# Patient Record
Sex: Female | Born: 1995 | Race: Black or African American | Hispanic: No | Marital: Single | Smoking: Never smoker
Health system: Southern US, Community
[De-identification: ages and names within clinical notes are randomized; demographics above are authoritative.]

---
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
## 2017-07-07 ENCOUNTER — Emergency Department (HOSPITAL_COMMUNITY)
Admission: EM | Admit: 2017-07-07 | Discharge: 2017-07-07 | Disposition: A | Discharge status: Home / Self Care | Payer: Self-pay | Attending: Emergency Medicine | Admitting: Emergency Medicine

## 2017-07-07 ENCOUNTER — Emergency Department (HOSPITAL_COMMUNITY): Payer: Self-pay

## 2017-07-07 ENCOUNTER — Encounter (HOSPITAL_COMMUNITY): Payer: Self-pay | Admitting: Emergency Medicine

## 2017-07-07 DIAGNOSIS — Z79899 Other long term (current) drug therapy: Secondary | ICD-10-CM | POA: Insufficient documentation

## 2017-07-07 DIAGNOSIS — R0789 Other chest pain: Secondary | ICD-10-CM | POA: Insufficient documentation

## 2017-07-07 LAB — CBC
HCT: 40 % (ref 36.0–46.0)
Hemoglobin: 13.5 g/dL (ref 12.0–15.0)
MCH: 31.1 pg (ref 26.0–34.0)
MCHC: 33.8 g/dL (ref 30.0–36.0)
MCV: 92.2 fL (ref 78.0–100.0)
PLATELETS: 205 10*3/uL (ref 150–400)
RBC: 4.34 MIL/uL (ref 3.87–5.11)
RDW: 11.2 % — ABNORMAL LOW (ref 11.5–15.5)
WBC: 4.3 10*3/uL (ref 4.0–10.5)

## 2017-07-07 LAB — BASIC METABOLIC PANEL
ANION GAP: 8 (ref 5–15)
BUN: 9 mg/dL (ref 6–20)
CO2: 24 mmol/L (ref 22–32)
CREATININE: 1.01 mg/dL — AB (ref 0.44–1.00)
Calcium: 9.1 mg/dL (ref 8.9–10.3)
Chloride: 105 mmol/L (ref 101–111)
GFR calc Af Amer: >60 mL/min (ref >60)
GFR calc non Af Amer: >60 mL/min (ref >60)
Glucose, Bld: 92 mg/dL (ref 65–99)
Potassium: 4 mmol/L (ref 3.5–5.1)
Sodium: 137 mmol/L (ref 135–145)

## 2017-07-07 LAB — I-STAT TROPONIN, ED: TROPONIN I, POC: 0 ng/mL (ref 0.00–0.08)

## 2017-07-07 LAB — I-STAT BETA HCG BLOOD, ED (MC, WL, AP ONLY): I-stat hCG, quantitative: <5 m[IU]/mL (ref <5)

## 2017-07-07 MED ORDER — IBUPROFEN 400 MG PO TABS: 400.0000 mg | ORAL_TABLET | Freq: Three times a day (TID) | ORAL | 0 refills | Status: AC

## 2017-07-07 NOTE — ED Provider Notes (Signed)
[facility] EMERGENCY DEPARTMENT Provider Note   CSN: [id number] Arrival date & time: 07/07/17  1609     History   Chief Complaint Chief Complaint  Patient presents with  . Chest Pain    HPI Diana Phillips is a 22 y.o. female.  HPI Vision presents with concern of chest pain. Pain is sternal, vertically oriented, sore, occurs without clear precipitant, last for several days, stops without clear intervention, pain is nonradiating, no associated dyspnea, is not exertional, patient notes that she is able to practice basketball without worsening of her pain. She is a Pharmacist, hospital, is generally healthy, has no history of congenital cardiac disease, patient's mother eventually joins her, who reiterates the same. Patient denies other ongoing complaints.   History reviewed. No pertinent past medical history.  Patient Active Problem List   Diagnosis Date Noted  . Fracture of radial neck, right, closed 02/02/2014    History reviewed. No pertinent surgical history.   OB History   None      Home Medications    Prior to Admission medications   Medication Sig Start Date End Date Taking? Authorizing Provider  erythromycin [facility]) ophthalmic ointment Place 1 application into the left eye 3 (three) times daily. 01/01/15   Lavera Guise, MD  ibuprofen (ADVIL,MOTRIN) 400 MG tablet Take 1 tablet (400 mg total) by mouth 3 (three) times daily for 3 days. Take one tablet three times daily for three days 07/07/17 07/10/17  Gerhard Munch, MD  loratadine (CLARITIN) 10 MG tablet Take 10 mg by mouth daily.    [provider]  omeprazole (PRILOSEC) 20 MG capsule Take 1 capsule (20 mg total) by mouth daily. 07/05/15   Roxy Horseman, PA-C  trimethoprim-polymyxin b (POLYTRIM) ophthalmic solution Place 1 drop into both eyes every 4 (four) hours. 07/05/15   Roxy Horseman, PA-C    Family History History reviewed. No pertinent family history.  Social History Social  History   Tobacco Use  . Smoking status: Never Smoker  . Smokeless tobacco: Never Used  Substance Use Topics  . Alcohol use: No    Alcohol/week: 0.0 oz  . Drug use: No     Allergies   Patient has no known allergies.   Review of Systems Review of Systems  Constitutional:       Per HPI, otherwise negative  HENT:       Per HPI, otherwise negative  Respiratory:       Per HPI, otherwise negative  Cardiovascular:       Per HPI, otherwise negative  Gastrointestinal: Negative for vomiting.  Endocrine:       Negative aside from HPI  Genitourinary:       Neg aside from HPI   Musculoskeletal:       Per HPI, otherwise negative  Skin: Negative.   Neurological: Negative for syncope.     Physical Exam Updated Vital Signs BP 139/79 (BP Location: Right Arm)   Pulse 64   Temp 99.6 F (37.6 C) (Oral)   Resp 14   LMP 05/30/2017   SpO2 100%   Physical Exam  Constitutional: She is oriented to person, place, and time. She appears well-developed and well-nourished. No distress.  HENT:  Head: Normocephalic and atraumatic.  Eyes: Conjunctivae and EOM are normal.  Cardiovascular: Normal rate and regular rhythm.  Pulmonary/Chest: Effort normal and breath sounds normal. No stridor. No respiratory distress.  Pain elicited with pressure over the sternum  Abdominal: She exhibits no distension.  Musculoskeletal: She  exhibits no edema.  Neurological: She is alert and oriented to person, place, and time. No cranial nerve deficit.  Skin: Skin is warm and dry.  Psychiatric: She has a normal mood and affect.  Nursing note and vitals reviewed.    ED Treatments / Results  Labs (all labs ordered are listed, but only abnormal results are displayed) Labs Reviewed  BASIC METABOLIC PANEL - Abnormal; Notable for the following components:      Result Value   Creatinine, Ser 1.01 (*)    All other components within normal limits  CBC - Abnormal; Notable for the following components:   RDW  11.2 (*)    All other components within normal limits  I-STAT TROPONIN, ED  I-STAT BETA HCG BLOOD, ED ([facility], [facility], [facility] ONLY)    EKG EKG Interpretation  Date/Time:  Saturday July 07 2017 16:19:30 EDT Ventricular Rate:  71 PR Interval:  142 QRS Duration: 68 QT Interval:  384 QTC Calculation: 417 R Axis:   74 Text Interpretation:  Normal sinus rhythm with sinus arrhythmia Normal ECG unremarkable ECG Confirmed by Gerhard MunchLockwood, Shermon Bozzi [phone]([id number]) on 07/07/2017 5:56:33 PM   Radiology Dg Chest 2 View  Result Date: 07/07/2017 CLINICAL DATA:  Left-sided chest pain. EXAM: CHEST - 2 VIEW COMPARISON:  Two-view chest x-ray 07/05/2015 FINDINGS: The heart size and mediastinal contours are within normal limits. Both lungs are clear. The visualized skeletal structures are unremarkable. IMPRESSION: Negative two view chest x-ray Electronically Signed   By: Marin Robertshristopher  Mattern M.D.   On: 07/07/2017 17:26    Procedures Procedures (including critical care time)  Medications Ordered in ED Medications - No data to display   Initial Impression / Assessment and Plan / ED Course  I have reviewed the triage vital signs and the nursing notes.  Pertinent labs & imaging results that were available during my care of the patient were reviewed by me and considered in my medical decision making (see chart for details).  Appearing young female with no history of congenital cardiac disease, or risk factors presents with ongoing episodic days long episodes of chest pain that is reproducible. With no exertional components, reassuring examination here, minimal risk profile, low suspicion for ischemic disease, some suspicion for musculoskeletal etiology.  With this was discussed at length with the patient and her mother and a companion. Patient discharged with ongoing NSAID therapy, primary care follow-up.  Final Clinical Impressions(s) / ED Diagnoses   Final diagnoses:  Atypical chest pain    ED Discharge Orders         Ordered    ibuprofen (ADVIL,MOTRIN) 400 MG tablet  3 times daily     07/07/17 Tawnya [id number]       Taniya Dasher, MD 07/07/17 1950

## 2017-07-07 NOTE — Discharge Instructions (Signed)
As discussed, your evaluation today has been largely reassuring.  But, it is important that you monitor your condition carefully, and do not hesitate to return to the ED if you develop new, or concerning changes in your condition.  Your pain is likely coming from inflammation of the chest wall itself.  Please follow-up with your physician for appropriate ongoing care.

## 2017-07-07 NOTE — ED Triage Notes (Signed)
Pt presents to ED for sudden onset of left sided chest pain while laying in bed this morning.  C/o shortness of breath with them, denies nausea, c/o constant dizziness.

## 2020-01-24 ENCOUNTER — Emergency Department (HOSPITAL_COMMUNITY)
Admission: EM | Admit: 2020-01-24 | Discharge: 2020-01-24 | Disposition: A | Discharge status: Home / Self Care | Payer: Medicaid Other | Attending: Emergency Medicine | Admitting: Emergency Medicine

## 2020-01-24 ENCOUNTER — Other Ambulatory Visit: Payer: Self-pay

## 2020-01-24 ENCOUNTER — Encounter (HOSPITAL_COMMUNITY): Payer: Self-pay | Admitting: Emergency Medicine

## 2020-01-24 DIAGNOSIS — Z20822 Contact with and (suspected) exposure to covid-19: Secondary | ICD-10-CM

## 2020-01-24 DIAGNOSIS — R509 Fever, unspecified: Secondary | ICD-10-CM | POA: Diagnosis present

## 2020-01-24 DIAGNOSIS — U071 COVID-19: Secondary | ICD-10-CM | POA: Insufficient documentation

## 2020-01-24 LAB — RESP PANEL BY RT-PCR (FLU A&B, COVID) ARPGX2
Influenza A by PCR: NEGATIVE
Influenza B by PCR: NEGATIVE
SARS Coronavirus 2 by RT PCR: POSITIVE — AB

## 2020-01-24 NOTE — ED Triage Notes (Signed)
Patient requesting Covid test reports productive cough , chest congestion , rhinorrhea , fever and chills this week .

## 2020-01-24 NOTE — ED Notes (Signed)
Patient verbalizes understanding of discharge instructions. Opportunity for questioning and answers were provided. Armband removed by staff, pt discharged from ED ambulatory.   

## 2020-01-25 NOTE — ED Provider Notes (Signed)
[facility] EMERGENCY DEPARTMENT Provider Note   CSN: [id number] Arrival date & time: 01/24/20  1958     History Chief Complaint  Patient presents with   Covid test / Cough/Fever    Diana Phillips is a 24 y.o. female.  The history is provided by the patient.  URI Presenting symptoms: congestion, cough, fatigue, fever and sore throat   Severity:  Moderate Onset quality:  Gradual Duration:  5 days Timing:  Constant Progression:  Waxing and waning Chronicity:  New Relieved by:  None tried Worsened by:  Nothing Ineffective treatments:  None tried Associated symptoms: headaches   Associated symptoms: no neck pain   Associated symptoms comment:  No SOB Risk factors: sick contacts   Risk factors comment:  No medical problems.  recent teammate with COVID positive      History reviewed. No pertinent past medical history.  Patient Active Problem List   Diagnosis Date Noted   Fracture of radial neck, right, closed 02/02/2014    History reviewed. No pertinent surgical history.   OB History   No obstetric history on file.     No family history on file.  Social History   Tobacco Use   Smoking status: Never Smoker   Smokeless tobacco: Never Used  Substance Use Topics   Alcohol use: No    Alcohol/week: 0.0 standard drinks   Drug use: No    Home Medications Prior to Admission medications   Medication Sig Start Date End Date Taking? Authorizing Provider  erythromycin [facility]) ophthalmic ointment Place 1 application into the left eye 3 (three) times daily. 01/01/15   Lavera Guise, MD  loratadine (CLARITIN) 10 MG tablet Take 10 mg by mouth daily.    [provider]  omeprazole (PRILOSEC) 20 MG capsule Take 1 capsule (20 mg total) by mouth daily. 07/05/15   Roxy Horseman, PA-C  trimethoprim-polymyxin b (POLYTRIM) ophthalmic solution Place 1 drop into both eyes every 4 (four) hours. 07/05/15   Roxy Horseman, PA-C    Allergies     Patient has no known allergies.  Review of Systems   Review of Systems  Constitutional: Positive for fatigue and fever.  HENT: Positive for congestion and sore throat.   Respiratory: Positive for cough.   Musculoskeletal: Negative for neck pain.  Neurological: Positive for headaches.  All other systems reviewed and are negative.   Physical Exam Updated Vital Signs BP 134/87    Pulse 72    Temp (!) 97.5 F (36.4 C) (Oral)    Resp 16    Ht 5\' 6"  (1.676 m)    Wt 77 kg    LMP 01/08/2020    SpO2 100%    BMI 27.40 kg/m   Physical Exam Vitals and nursing note reviewed.  Constitutional:      General: She is not in acute distress.    Appearance: Normal appearance. She is well-developed, normal weight and well-nourished.  HENT:     Head: Normocephalic and atraumatic.     Mouth/Throat:     Mouth: Mucous membranes are moist.     Pharynx: Posterior oropharyngeal erythema present. No oropharyngeal exudate.  Eyes:     Extraocular Movements: EOM normal.     Pupils: Pupils are equal, round, and reactive to light.  Cardiovascular:     Rate and Rhythm: Normal rate and regular rhythm.     Pulses: Intact distal pulses.     Heart sounds: Normal heart sounds. No murmur heard. No friction rub.  Pulmonary:  Effort: Pulmonary effort is normal.     Breath sounds: Normal breath sounds. No wheezing or rales.  Abdominal:     General: Bowel sounds are normal. There is no distension.     Palpations: Abdomen is soft.     Tenderness: There is no abdominal tenderness. There is no guarding or rebound.  Musculoskeletal:        General: No tenderness. Normal range of motion.     Comments: No edema  Lymphadenopathy:     Cervical: No cervical adenopathy.  Skin:    General: Skin is warm and dry.     Findings: No rash.  Neurological:     Mental Status: She is alert and oriented to person, place, and time.     Cranial Nerves: No cranial nerve deficit.  Psychiatric:        Mood and Affect: Mood and  affect and mood normal.        Behavior: Behavior normal.        Thought Content: Thought content normal.     ED Results / Procedures / Treatments   Labs (all labs ordered are listed, but only abnormal results are displayed) Labs Reviewed  RESP PANEL BY RT-PCR (FLU A&B, COVID) ARPGX2 - Abnormal; Notable for the following components:      Result Value   SARS Coronavirus 2 by RT PCR POSITIVE (*)    All other components within normal limits    EKG None  Radiology No results found.  Procedures Procedures (including critical care time)  Medications Ordered in ED Medications - No data to display  ED Course  I have reviewed the triage vital signs and the nursing notes.  Pertinent labs & imaging results that were available during my care of the patient were reviewed by me and considered in my medical decision making (see chart for details).    MDM Rules/Calculators/A&P                          Pt with symptoms consistent with COVID.  Well appearing here.  No signs of breathing difficulty  No signs of pharyngitis, otitis or abnormal abdominal findings.  Pt is COVID positive.  Otherwise she is sating well and well appearing.  Pt d/ced home.  Diana Phillips was evaluated in Emergency Department on 01/25/2020 for the symptoms described in the history of present illness. She was evaluated in the context of the global COVID-19 pandemic, which necessitated consideration that the patient might be at risk for infection with the SARS-CoV-2 virus that causes COVID-19. Institutional protocols and algorithms that pertain to the evaluation of patients at risk for COVID-19 are in a state of rapid change based on information released by regulatory bodies including the CDC and federal and state organizations. These policies and algorithms were followed during the patient's care in the ED.    Final Clinical Impression(s) / ED Diagnoses Final diagnoses:  Suspected COVID-19 virus infection    Rx /  DC Orders ED Discharge Orders    None       Gwyneth Sprout, MD 01/25/20 [phone]

## 2020-10-26 ENCOUNTER — Emergency Department (HOSPITAL_COMMUNITY): Payer: Medicaid Other

## 2020-10-26 ENCOUNTER — Other Ambulatory Visit: Payer: Self-pay

## 2020-10-26 ENCOUNTER — Emergency Department (HOSPITAL_COMMUNITY)
Admission: EM | Admit: 2020-10-26 | Discharge: 2020-10-27 | Disposition: A | Discharge status: Home / Self Care | Payer: Medicaid Other | Attending: Emergency Medicine | Admitting: Emergency Medicine

## 2020-10-26 DIAGNOSIS — W228XXA Striking against or struck by other objects, initial encounter: Secondary | ICD-10-CM | POA: Insufficient documentation

## 2020-10-26 DIAGNOSIS — S61411A Laceration without foreign body of right hand, initial encounter: Secondary | ICD-10-CM | POA: Diagnosis not present

## 2020-10-26 DIAGNOSIS — Z5321 Procedure and treatment not carried out due to patient leaving prior to being seen by health care provider: Secondary | ICD-10-CM | POA: Diagnosis not present

## 2020-10-26 DIAGNOSIS — S6991XA Unspecified injury of right wrist, hand and finger(s), initial encounter: Secondary | ICD-10-CM | POA: Diagnosis present

## 2020-10-26 NOTE — ED Notes (Signed)
Patient called x1 for re-assessment with no response

## 2020-10-26 NOTE — ED Triage Notes (Signed)
Pt states punching car window. Presents with several laceration to right hand. Bleeding contained. Unknown last tetanus

## 2020-10-27 NOTE — ED Notes (Signed)
PT was call for room X3 did not answer.

## 2020-11-11 ENCOUNTER — Ambulatory Visit (HOSPITAL_COMMUNITY)
Admission: EM | Admit: 2020-11-11 | Discharge: 2020-11-11 | Disposition: A | Discharge status: Other Acute Inpatient Hospital | Payer: Medicaid Other | Attending: Family | Admitting: Family

## 2020-11-11 ENCOUNTER — Other Ambulatory Visit: Payer: Self-pay

## 2020-11-11 ENCOUNTER — Inpatient Hospital Stay (HOSPITAL_COMMUNITY)
Admission: AD | Admit: 2020-11-11 | Discharge: 2020-11-15 | DRG: 885 | Disposition: A | Discharge status: Home / Self Care | Payer: Medicaid Other | Source: Intra-hospital | Attending: Emergency Medicine | Admitting: Emergency Medicine

## 2020-11-11 ENCOUNTER — Encounter (HOSPITAL_COMMUNITY): Payer: Self-pay | Admitting: Family

## 2020-11-11 DIAGNOSIS — F322 Major depressive disorder, single episode, severe without psychotic features: Secondary | ICD-10-CM | POA: Diagnosis present

## 2020-11-11 DIAGNOSIS — R45851 Suicidal ideations: Secondary | ICD-10-CM | POA: Insufficient documentation

## 2020-11-11 DIAGNOSIS — G4700 Insomnia, unspecified: Secondary | ICD-10-CM | POA: Diagnosis present

## 2020-11-11 DIAGNOSIS — F419 Anxiety disorder, unspecified: Secondary | ICD-10-CM | POA: Diagnosis present

## 2020-11-11 DIAGNOSIS — Z20822 Contact with and (suspected) exposure to covid-19: Secondary | ICD-10-CM | POA: Diagnosis present

## 2020-11-11 DIAGNOSIS — F323 Major depressive disorder, single episode, severe with psychotic features: Secondary | ICD-10-CM | POA: Insufficient documentation

## 2020-11-11 LAB — HEMOGLOBIN A1C
Hgb A1c MFr Bld: 4.6 % — ABNORMAL LOW (ref 4.8–5.6)
Mean Plasma Glucose: 85.32 mg/dL

## 2020-11-11 LAB — CBC WITH DIFFERENTIAL/PLATELET
Abs Immature Granulocytes: 0.02 10*3/uL (ref 0.00–0.07)
Basophils Absolute: 0 10*3/uL (ref 0.0–0.1)
Basophils Relative: 1 %
Eosinophils Absolute: 0 10*3/uL (ref 0.0–0.5)
Eosinophils Relative: 1 %
HCT: 39.6 % (ref 36.0–46.0)
Hemoglobin: 13.7 g/dL (ref 12.0–15.0)
Immature Granulocytes: 0 %
Lymphocytes Relative: 11 %
Lymphs Abs: 0.6 10*3/uL — ABNORMAL LOW (ref 0.7–4.0)
MCH: 30.7 pg (ref 26.0–34.0)
MCHC: 34.6 g/dL (ref 30.0–36.0)
MCV: 88.8 fL (ref 80.0–100.0)
Monocytes Absolute: 0.3 10*3/uL (ref 0.1–1.0)
Monocytes Relative: 5 %
Neutro Abs: 4.4 10*3/uL (ref 1.7–7.7)
Neutrophils Relative %: 82 %
Platelets: 239 10*3/uL (ref 150–400)
RBC: 4.46 MIL/uL (ref 3.87–5.11)
RDW: 11.2 % — ABNORMAL LOW (ref 11.5–15.5)
WBC: 5.3 10*3/uL (ref 4.0–10.5)
nRBC: 0 % (ref 0.0–0.2)

## 2020-11-11 LAB — COMPREHENSIVE METABOLIC PANEL
ALT: 12 U/L (ref 0–44)
AST: 20 U/L (ref 15–41)
Albumin: 4.5 g/dL (ref 3.5–5.0)
Alkaline Phosphatase: 54 U/L (ref 38–126)
Anion gap: 10 (ref 5–15)
BUN: 11 mg/dL (ref 6–20)
CO2: 24 mmol/L (ref 22–32)
Calcium: 9.8 mg/dL (ref 8.9–10.3)
Chloride: 107 mmol/L (ref 98–111)
Creatinine, Ser: 0.99 mg/dL (ref 0.44–1.00)
GFR, Estimated: >60 mL/min (ref >60)
Glucose, Bld: 90 mg/dL (ref 70–99)
Potassium: 3.8 mmol/L (ref 3.5–5.1)
Sodium: 141 mmol/L (ref 135–145)
Total Bilirubin: 1.2 mg/dL (ref 0.3–1.2)
Total Protein: 7.6 g/dL (ref 6.5–8.1)

## 2020-11-11 LAB — POCT URINE DRUG SCREEN - MANUAL ENTRY (I-SCREEN)
POC Amphetamine UR: NOT DETECTED
POC Buprenorphine (BUP): NOT DETECTED
POC Cocaine UR: NOT DETECTED
POC Marijuana UR: NOT DETECTED
POC Methadone UR: NOT DETECTED
POC Methamphetamine UR: NOT DETECTED
POC Morphine: NOT DETECTED
POC Oxazepam (BZO): NOT DETECTED
POC Oxycodone UR: NOT DETECTED
POC Secobarbital (BAR): NOT DETECTED

## 2020-11-11 LAB — LIPID PANEL
Cholesterol: 140 mg/dL (ref 0–200)
HDL: 40 mg/dL — ABNORMAL LOW (ref >40)
LDL Cholesterol: 93 mg/dL (ref 0–99)
Total CHOL/HDL Ratio: 3.5 RATIO
Triglycerides: 37 mg/dL (ref <150)
VLDL: 7 mg/dL (ref 0–40)

## 2020-11-11 LAB — ETHANOL: Alcohol, Ethyl (B): <10 mg/dL (ref <10)

## 2020-11-11 LAB — RESP PANEL BY RT-PCR (FLU A&B, COVID) ARPGX2
Influenza A by PCR: NEGATIVE
Influenza B by PCR: NEGATIVE
SARS Coronavirus 2 by RT PCR: NEGATIVE

## 2020-11-11 LAB — TSH: TSH: 0.995 u[IU]/mL (ref 0.350–4.500)

## 2020-11-11 LAB — MAGNESIUM: Magnesium: 1.7 mg/dL (ref 1.7–2.4)

## 2020-11-11 LAB — POCT PREGNANCY, URINE: Preg Test, Ur: NEGATIVE

## 2020-11-11 LAB — POC SARS CORONAVIRUS 2 AG -  ED: SARS Coronavirus 2 Ag: NEGATIVE

## 2020-11-11 LAB — POC SARS CORONAVIRUS 2 AG: SARSCOV2ONAVIRUS 2 AG: NEGATIVE

## 2020-11-11 MED ORDER — TRAZODONE HCL 50 MG PO TABS
50.0000 mg | ORAL_TABLET | Freq: Every evening | ORAL | Status: DC | PRN
  Administered 2020-11-11: 50 mg via ORAL
  Filled 2020-11-11: qty 1

## 2020-11-11 MED ORDER — ALUM & MAG HYDROXIDE-SIMETH 200-200-20 MG/5ML PO SUSP: 30.0000 mL | ORAL | Status: DC | PRN

## 2020-11-11 MED ORDER — HYDROXYZINE HCL 25 MG PO TABS
25.0000 mg | ORAL_TABLET | Freq: Three times a day (TID) | ORAL | Status: DC | PRN
  Administered 2020-11-11: 25 mg via ORAL
  Filled 2020-11-11 (×2): qty 1

## 2020-11-11 MED ORDER — MAGNESIUM HYDROXIDE 400 MG/5ML PO SUSP: 30.0000 mL | Freq: Every day | ORAL | Status: DC | PRN

## 2020-11-11 MED ORDER — ACETAMINOPHEN 325 MG PO TABS: 650.0000 mg | ORAL_TABLET | Freq: Four times a day (QID) | ORAL | Status: DC | PRN

## 2020-11-11 MED ORDER — TRAZODONE HCL 50 MG PO TABS: 50.0000 mg | ORAL_TABLET | Freq: Every evening | ORAL | Status: DC | PRN

## 2020-11-11 MED ORDER — ENSURE ENLIVE PO LIQD
237.0000 mL | Freq: Two times a day (BID) | ORAL | Status: DC
  Administered 2020-11-12 – 2020-11-15 (×5): 237 mL via ORAL
  Filled 2020-11-11 (×9): qty 237

## 2020-11-11 MED ORDER — HYDROXYZINE HCL 25 MG PO TABS: 25.0000 mg | ORAL_TABLET | Freq: Three times a day (TID) | ORAL | Status: DC | PRN

## 2020-11-11 NOTE — Tx Team (Signed)
Initial Treatment Plan 11/11/2020 11:11 PM August Luz [id number]    PATIENT STRESSORS: Financial difficulties   Loss of significant relationship-GF broke up with her   Occupational concerns     PATIENT STRENGTHS: Average or above average intelligence  Capable of independent living  Motivation for treatment/growth  Physical Health  Supportive family/friends    PATIENT IDENTIFIED PROBLEMS: SI-pt tried to drown herself 2 weeks ago  Anxiety  Depression  Ineffective coping skills  ("Pt wants to work on Pharmacologist and dealing with the breakup of her relationship")             DISCHARGE CRITERIA:  Improved stabilization in mood, thinking, and/or behavior Motivation to continue treatment in a less acute level of care Verbal commitment to aftercare and medication compliance  PRELIMINARY DISCHARGE PLAN: Attend aftercare/continuing care group Outpatient therapy Return to previous living arrangement  PATIENT/FAMILY INVOLVEMENT: This treatment plan has been presented to and reviewed with the patient, Diana Phillips, and/or family member.  The patient and family have been given the opportunity to ask questions and make suggestions.  Victorino December, RN 11/11/2020, 11:11 PM

## 2020-11-11 NOTE — BH Assessment (Signed)
Diana Phillips, Emergent, MR #[id number]; 25 years old presents this date with her mother, [name], [phone].  Pt reports SI for a month, (tearful) with a plan [place].  Pt reports hearing voices, ' whispers, I can't make it out".  Pt denies HI.  Pt denies any prior MH diagnosis or prescribed medication for symptom management.  MSE signed by patient.

## 2020-11-11 NOTE — ED Notes (Signed)
Report attempted x 1, [facility]/ RN Garnet to return call.

## 2020-11-11 NOTE — ED Notes (Signed)
Report called RN Linton Rump, [facility], Pending [facility] Transport at 9:30pm.

## 2020-11-11 NOTE — Progress Notes (Signed)
   11/11/20 2155  Psych Admission Type (Psych Patients Only)  Admission Status Voluntary  Psychosocial Assessment  Patient Complaints Depression;Anxiety;Sadness;Crying spells;Appetite decrease;Shakiness;Self-harm thoughts  Eye Contact Brief  Facial Expression Sad  Affect Sad;Depressed  Speech Logical/coherent;Soft  Interaction Minimal  Motor Activity Hand-wringing  Appearance/Hygiene Body odor  Behavior Characteristics Cooperative  Mood Depressed;Anxious;Sad  Thought Process  Coherency WDL  Content WDL  Delusions None reported or observed  Perception WDL  Hallucination None reported or observed  Judgment Poor  Confusion None  Danger to Self  Current suicidal ideation? Passive  Self-Injurious Behavior No self-injurious ideation or behavior indicators observed or expressed   Agreement Not to Harm Self Yes  Description of Agreement verbal  Danger to Others  Danger to Others None reported or observed

## 2020-11-11 NOTE — Progress Notes (Signed)
Patient ID: Diana Phillips, female   DOB: 04-Feb-1995, 25 y.o.   MRN: [id number]  D: Pt here voluntarily from [facility]. Pt denies HI/AVH and pain at this time. Pt endorses thoughts of self-harm but contracts for safety while at [facility]. Pt tried to drown herself 2 weeks ago in a lake but said that she couldn't stay under the water. Pt plan when she went to [facility] was still to go back to the lake and drown herself. Pt came to [facility] with her mother for help. Pt is tearful, hopeless, helpless, sad, sullen, decreased appetite, and shaky during assessment. Pt endorsed panic attacks in the past but none lately. Pt also endorses poor sleep. This is pt's first inpatient admission. Pt denies history of mental health issues.  Pt just graduated from college. 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 Pt was not picked in the draft but received a contract to play [place]. Pt went but got sick and was sent home. She says that the contract was terminated. This is one of her stressors. Pt counts the ending of a 2-year relationship as another stressor. "She was the one person who kept me going and kept me up." Pt continues to cry. Pt says her former girlfriend is also a Building services engineer.   Pt says she does not know how to cope with the breakup and her feelings about it. Pt rates anxiety 10/10 and depression 10/10 right now. She says she has lost 35 pounds in the last 4 months (average 9 pounds a month) due to decreased appetite related to depression and the breakup.   A: Pt was offered support and encouragement. Pt is cooperative during assessment. VS assessed and admission paperwork signed. Belongings searched and contraband items placed in locker. Non-invasive skin search completed: pt has tattoos on bilateral arms and legs. Pt offered food and drink and both refused. Pt introduced to unit milieu by nursing staff. Q 15 minute checks were started for safety.   R: Pt contacting her mother on the phone. Pt safety maintained on  unit.

## 2020-11-11 NOTE — Progress Notes (Signed)
Patient information has been sent to [facility] via secure chat to review for potential admission. Patient meets inpatient criteria per Doran Heater, NP.   Situation ongoing, CSW will continue to monitor progress.    Signed:  Damita Dunnings, MSW, LCSW-A  11/11/2020 2:22 PM

## 2020-11-11 NOTE — BH Assessment (Signed)
Comprehensive Clinical Assessment (CCA) Note  11/11/2020 August Luz [id number]  Disposition: Per Doran Heater, NP inpatient treatment is recommended.  Disposition SW to pursue appropriate inpatient options.  The patient demonstrates the following risk factors for suicide: Chronic risk factors for suicide include: psychiatric disorder of untreated depression and previous suicide attempts x 1 two weeks ago by attempt to drown self, hx of SI gestures of cutting . Acute risk factors for suicide include: social withdrawal/isolation and loss (financial, interpersonal, professional). Protective factors for this patient include: positive social support and responsibility to others (children, family). Considering these factors, the overall suicide risk at this point appears to be high. Patient is not appropriate for outpatient follow up.  Patient is a 25 year old female with a history of untreated depression who presents voluntarily to [facility] for assessment.  Patient presents with mother after calling the suicide hotline.  Patient presents quite despondent, tearful struggling to engage in assessment initially.  She admits to having a plan to drown herself in a lake today.  She had visited this lake two weeks ago and attempted to drown herself, "but I couldn't stay down."  She has had multiple recent devastations and is having difficulty coping.  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 She had hopes of playing professional basketball, however things have not panned out.  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  She was recently devastated by hopes to play in [place] and this didn't pan out.  Patient is tearful throughout assessment. Patient denies HI or SA hx.  She admits AVH, reporting she feels someone is standing behind her and she hears whispering.  Patient has no past psychiatric history and no family history of MI. Patient is  uanble to contract for safety.  Patient's mother confirmed patient's report of recent devastations.  She is concerned for her safety at this point.  She shares patient is having trouble finding work, which is adding to the depression and hopelessness she was already experiencing.   Chief Complaint:  Chief Complaint  Patient presents with   Suicidal   Visit Diagnosis: Depressive Disorder Unspecified   Flowsheet Row ED from 11/11/2020 in [facility]  Thoughts that you would be better off dead, or of hurting yourself in some way More than half the days  PHQ-9 Total Score 19      Flowsheet Row ED from 11/11/2020 in [facility] ED from 10/26/2020 in [facility] EMERGENCY DEPARTMENT  C-SSRS RISK CATEGORY Error: Question 6 not populated No Risk     Risk not populating - Risk=High  CCA Screening, Triage and Referral (STR)  Patient Reported Information How did you hear about [place]? Family/Friend  What Is the Reason for Your Visit/Call Today? Patient presents with mother after calling the suicide hotline.  Patient presents quite despondent, tearful struggling to engage in assessment initially.  She admits to having a plan to drown herself in a lake today.  She had visited this lake two weeks ago and attempted to drown herself, "but I couldn't stay down."  She has had multiple recent devastations and is having difficulty coping.  Patient denies HI or SA hx.  She admits AVH, reporting she feels someone is standing behind her and she hears whispering.  Patient is uanble to contract for safety.  How Long Has This Been Causing You Problems? 1 wk - 1 month  What Do You Feel Would Help You the Most Today? Treatment for Depression or other mood problem   Have You Recently Had Any Thoughts About Hurting Yourself? Yes  Are You Planning to Commit Suicide/Harm Yourself At This time? Yes   Have you Recently Had Thoughts About  Hurting Someone Karolee Ohs? No  Are You Planning to Harm Someone at This Time? No  Explanation: No data recorded  Have You Used Any Alcohol or Drugs in the Past 24 Hours? No  How Long Ago Did You Use Drugs or Alcohol? No data recorded What Did You Use and How Much? No data recorded  Do You Currently Have a Therapist/Psychiatrist? No  Name of Therapist/Psychiatrist: No data recorded  Have You Been Recently Discharged From Any Office Practice or Programs? No  Explanation of Discharge From Practice/Program: No data recorded    CCA Screening Triage Referral Assessment Type of Contact: Face-to-Face  Telemedicine Service Delivery:   Is this Initial or Reassessment? No data recorded Date Telepsych consult ordered in [facility]:  No data recorded Time Telepsych consult ordered in [facility]:  No data recorded Location of Assessment: [facility] Assessment Services  Provider Location: [facility] Assessment Services   Collateral Involvement: No data recorded  Does Patient Have a Automotive engineer Guardian? No data recorded Name and Contact of Legal Guardian: No data recorded If Minor and Not Living with Parent(s), Who has Custody? No data recorded Is CPS involved or ever been involved? Never  Is APS involved or ever been involved? Never   Patient Determined To Be At Risk for Harm To Self or Others Based on Review of Patient Reported Information or Presenting Complaint? Yes, for Self-Harm  Method: No data recorded Availability of Means: No data recorded Intent: No data recorded Notification Required: No data recorded Additional Information for Danger to Others Potential: No data recorded Additional Comments for Danger to Others Potential: No data recorded Are There Guns or Other Weapons in Your Home? No data recorded Types of Guns/Weapons: No data recorded Are These Weapons Safely Secured?                            No data recorded Who Could Verify You Are Able To Have These Secured: No data  recorded Do You Have any Outstanding Charges, Pending Court Dates, Parole/Probation? No data recorded Contacted To Inform of Risk of Harm To Self or Others: Family/Significant Other:    Does Patient Present under Involuntary Commitment? No  IVC Papers Initial File Date: No data recorded  [place] of Residence: [place]   Patient Currently Receiving the Following Services: Not Receiving Services   Determination of Need: Emergent (2 hours)   Options For Referral: [facility]; Inpatient Hospitalization     CCA Biopsychosocial Patient Reported Schizophrenia/Schizoaffective Diagnosis in Past: No   Strengths: Open to treatment, has support from mother, educated -has M.A degree   Mental Health Symptoms Depression:   Difficulty Concentrating; Hopelessness; Increase/decrease in appetite; Sleep (too much or little); Worthlessness   Duration of Depressive symptoms:  Duration of Depressive Symptoms: Greater than two weeks   Mania:   None   Anxiety:    Worrying; Tension   Psychosis:   Hallucinations   Duration of Psychotic symptoms:  Duration of Psychotic Symptoms: Less than six months   Trauma:   None   Obsessions:   None   Compulsions:   None   Inattention:   N/A   Hyperactivity/Impulsivity:  N/A   Oppositional/Defiant Behaviors:   N/A   Emotional Irregularity:   Chronic feelings of emptiness; Recurrent suicidal behaviors/gestures/threats   Other Mood/Personality Symptoms:  No data recorded   Mental Status Exam Appearance and self-care  Stature:   Average   Weight:   Thin   Clothing:   Casual   Grooming:   Normal   Cosmetic use:   None   Posture/gait:   Slumped   Motor activity:   Slowed   Sensorium  Attention:   Normal   Concentration:   Normal   Orientation:   X5   Recall/memory:   Normal   Affect and Mood  Affect:   Depressed; Flat   Mood:   Depressed   Relating  Eye contact:   Avoided   Facial  expression:   Depressed; Sad   Attitude toward examiner:   Cooperative   Thought and Language  Speech flow:  Soft; Clear and Coherent   Thought content:   Appropriate to Mood and Circumstances   Preoccupation:   Suicide   Hallucinations:   Auditory; Olfactory   Organization:  No data recorded  [employer] of Knowledge:   Average   Intelligence:   Above Average   Abstraction:   Normal   Judgement:   Fair   Reality Testing:   Adequate   Insight:   Gaps   Decision Making:   Impulsive; Vacilates   Social Functioning  Social Maturity:   Responsible   Social Judgement:   Normal   Stress  Stressors:   Transitions; Work   Coping Ability:   Exhausted; Overwhelmed   Skill Deficits:   Communication; Self-care; Decision making   Supports:   Family     Religion: Religion/Spirituality Are You A Religious Person?: No  Leisure/Recreation: Leisure / Recreation Do You Have Hobbies?: No  Exercise/Diet: Exercise/Diet Do You Exercise?: No Have You Gained or Lost A Significant Amount of Weight in the Past Six Months?: Yes-Lost Number of Pounds Lost?:  (unknown) Do You Follow a Special Diet?: No Do You Have Any Trouble Sleeping?: Yes Explanation of Sleeping Difficulties: Sleeps 2-3 hours per night for past sev mos   CCA Employment/Education Employment/Work Situation: Employment / Work Situation Employment Situation: Unemployed Patient's Job has Been Impacted by Current Illness: Yes Describe how Patient's Job has Been Impacted: Difficulty shifting focus with [employer] focus to [employer] - 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  She continues to experience devestations. Has Patient ever Been in the [employer]?: No  Education: Education Is Patient Currently Attending School?: No Last Grade Completed: 14 Did You Attend College?: Yes What Type of College Degree Do you Have?: Communications undergrad - Criminal  Justice Masters Did You Have An Individualized Education Program (IIEP): No Did You Have Any Difficulty At School?: No Patient's Education Has Been Impacted by Current Illness: No   CCA Family/Childhood History Family and Relationship History: Family history Marital status: Single Does patient have children?: No  Childhood History:  Childhood History By whom was/is the patient raised?: Mother Did patient suffer any verbal/emotional/physical/sexual abuse as a child?: No Did patient suffer from severe childhood neglect?: No Has patient ever been sexually abused/assaulted/raped as an adolescent or adult?: No Was the patient ever a victim of a crime or a disaster?: No Witnessed domestic violence?: No Has patient been affected by domestic violence as an adult?: No  Child/Adolescent Assessment:     CCA Substance Use Alcohol/Drug Use: Alcohol /  Drug Use Pain Medications: See MAR Prescriptions: See MAR Over the Counter: See MAR History of alcohol / drug use?: No history of alcohol / drug abuse      ASAM's:  Six Dimensions of Multidimensional Assessment  Dimension 1:  Acute Intoxication and/or Withdrawal Potential:      Dimension 2:  Biomedical Conditions and Complications:      Dimension 3:  Emotional, Behavioral, or Cognitive Conditions and Complications:     Dimension 4:  Readiness to Change:     Dimension 5:  Relapse, Continued use, or Continued Problem Potential:     Dimension 6:  Recovery/Living Environment:     ASAM Severity Score:    ASAM Recommended Level of Treatment:     Substance use Disorder (SUD)    Recommendations for Services/Supports/Treatments:    Discharge Disposition:    DSM5 Diagnoses: Patient Active Problem List   Diagnosis Date Noted   Fracture of radial neck, right, closed 02/02/2014    Referrals to Alternative Service(s):  Yetta Glassman, [facility]

## 2020-11-11 NOTE — ED Provider Notes (Signed)
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 She endorses suicidal ideation with a plan [place].  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  She states "after college I was a top player but I did not get drafted, no one picked me."  She was recently notified by her coach that she would be involved in a professional basketball team in [place] however the team picked a different player. She reports she is not officially done with basketball as she still has an agent who is looking into opportunities for her to continue her basketball career. Her mother, [name], agrees that patient is a Social research officer, government.  Patient is assessed face-to-face by nurse practitioner.  She is seated in assessment area, no acute distress.  She is alert and oriented, pleasant and cooperative during assessment.  She reports depressed mood with congruent affect, tearful throughout assessment.  She endorses suicidal ideation with a plan to drown herself.  She denies any prior history of suicide attempts.  She endorses self-harm by cutting and punching out the windows on her car, last episode of punching her car window approximately 1 week ago.  She denies homicidal ideation. She has  normal speech and behavior.  She endorses auditory hallucinations, "I hear whispers, I cannot understand anything, I feel like there is someone over my shoulder."   Patient is able to converse coherently with goal-directed thoughts and no distractibility or preoccupation.  Objectively there is no evidence of psychosis/mania or delusional thinking. She endorses decreased sleep and appetite since May 2022.  She attributes this to graduation feels that "things started not falling into place after graduation." She is not currently to outpatient psychiatry.  She denies current medications.  She denies family history of mental illness.  She resides in [place] with her mother, mother's husband and 2 brothers ages 9 and 32.  She denies access to weapons.  She is not currently employed.  She denies alcohol and substance use.  Patient offered support and encouragement.   PHQ 2-9:   Flowsheet Row ED from 10/26/2020 in [facility] EMERGENCY DEPARTMENT  C-SSRS RISK CATEGORY No Risk        Total Time spent with patient: 30 minutes  Musculoskeletal  Strength & Muscle Tone: within normal limits Gait & Station: normal Patient leans: N/A  Psychiatric Specialty Exam  Presentation General Appearance: Appropriate for Environment; Casual  Eye Contact:Good  Speech:Normal Rate; Clear and Coherent  Speech Volume:Normal  Handedness:Right   Mood and Affect  Mood:Depressed  Affect:Tearful; Depressed   Thought Process  Thought Processes:Coherent; Goal Directed  Descriptions of Associations:Intact  Orientation:Full (Time, Place and Person)  Thought Content:Logical    Hallucinations:Hallucinations: Auditory Description of Auditory Hallucinations: "I hear whispers"  Ideas of Reference:None  Suicidal Thoughts:Suicidal Thoughts: Yes, Active SI Active Intent and/or  Plan: With Intent; With Plan; With Means to Carry Out  Homicidal Thoughts:Homicidal Thoughts:  No   Sensorium  Memory:Immediate Good; Recent Good; Remote Good  Judgment:Good  Insight:Fair   Executive Functions  Concentration:Good  Attention Span:Good  Recall:Good  Fund of Knowledge:Good  Language:Good   Psychomotor Activity  Psychomotor Activity:Psychomotor Activity: Normal   Assets  Assets:Communication Skills; Desire for Improvement; Financial Resources/Insurance; Housing; Intimacy; Leisure Time; Physical Health; Resilience; Social Support; Talents/Skills; Transportation   Sleep  Sleep:Sleep: Poor   Nutritional Assessment (For OBS and [facility] admissions only) Has the patient had a weight loss or gain of 10 pounds or more in the last 3 months?: No Has the patient had a decrease in food intake/or appetite?: Yes Does the patient have dental problems?: No Does the patient have eating habits or behaviors that may be indicators of an eating disorder including binging or inducing vomiting?: No Has the patient recently lost weight without trying?: 0 Has the patient been eating poorly because of a decreased appetite?: 1 Malnutrition Screening Tool Score: 1   Physical Exam Vitals and nursing note reviewed.  Constitutional:      Appearance: Normal appearance. She is well-developed.  HENT:     Head: Normocephalic and atraumatic.     Nose: Nose normal.  Cardiovascular:     Rate and Rhythm: Normal rate.  Pulmonary:     Effort: Pulmonary effort is normal.  Musculoskeletal:        General: Normal range of motion.     Cervical back: Normal range of motion.  Skin:    General: Skin is warm and dry.  Neurological:     Mental Status: She is alert and oriented to person, place, and time.  Psychiatric:        Attention and Perception: Attention normal. She perceives auditory hallucinations.        Mood and Affect: Mood is depressed. Affect is tearful.        Speech: Speech normal.        Behavior: Behavior normal. Behavior is cooperative.        Thought Content:  Thought content includes suicidal ideation. Thought content includes suicidal plan.        Cognition and Memory: Cognition and memory normal.        Judgment: Judgment normal.   Review of Systems  Constitutional: Negative.   HENT: Negative.    Eyes: Negative.   Respiratory: Negative.    Cardiovascular: Negative.   Gastrointestinal: Negative.   Genitourinary: Negative.   Musculoskeletal: Negative.   Skin: Negative.   Neurological: Negative.   Endo/Heme/Allergies: Negative.   Psychiatric/Behavioral:  Positive for depression, hallucinations and suicidal ideas.    Blood pressure 120/82, pulse 85, temperature 98.2 F (36.8 C), temperature source Oral, resp. rate 16, SpO2 99 %. There is no height or weight on file to calculate BMI.  Past Psychiatric History: none reported  Is the patient at risk to self? Yes  Has the patient been a risk to self in the past 6 months? Yes .    Has the patient been a risk to self within the distant past? No   Is the patient a risk to others? No   Has the patient been a risk to others in the past 6 months? No   Has the patient been a risk to others within the distant past? No   Past Medical History: No past medical history on file. No past surgical history on file.  Family History: No family history  on file.  Social History:  Social History   Socioeconomic History   Marital status: Single    Spouse name: Not on file   Number of children: Not on file   Years of education: Not on file   Highest education level: Not on file  Occupational History   Not on file  Tobacco Use   Smoking status: Never   Smokeless tobacco: Never  Substance and Sexual Activity   Alcohol use: No    Alcohol/week: 0.0 standard drinks   Drug use: No   Sexual activity: Yes    Birth control/protection: None  Other Topics Concern   Not on file  Social History Narrative   Not on file   Social Determinants of Health   Financial Resource Strain: Not on file  Food  Insecurity: Not on file  Transportation Needs: Not on file  Physical Activity: Not on file  Stress: Not on file  Social Connections: Not on file  Intimate Partner Violence: Not on file    SDOH:  SDOH Screenings   Alcohol Screen: Not on file  Depression (PHQ2-9): Not on file  Financial Resource Strain: Not on file  Food Insecurity: Not on file  Housing: Not on file  Physical Activity: Not on file  Social Connections: Not on file  Stress: Not on file  Tobacco Use: Low Risk    Smoking Tobacco Use: Never   Smokeless Tobacco Use: Never  Transportation Needs: Not on file    Last Labs:  No visits with results within 6 Month(s) from this visit.  Latest known visit with results is:  Admission on 01/24/2020, Discharged on 01/24/2020  Component Date Value Ref Range Status   SARS Coronavirus 2 by RT PCR 01/24/2020 POSITIVE (A) NEGATIVE Final   Comment: RESULT CALLED TO, READ BACK BY AND VERIFIED WITH: A DAVISON RN 01/24/20 AT 2132 SK (NOTE) SARS-CoV-2 target nucleic acids are DETECTED.  The SARS-CoV-2 RNA is generally detectable in upper respiratory specimens during the acute phase of infection. Positive results are indicative of the presence of the identified virus, but do not rule out bacterial infection or co-infection with other pathogens not detected by the test. Clinical correlation with patient history and other diagnostic information is necessary to determine patient infection status. The expected result is Negative.  Fact Sheet for Patients: BloggerCourse.com  Fact Sheet for Healthcare Providers: SeriousBroker.it  This test is not yet approved or cleared by the [place] FDA and  has been authorized for detection and/or diagnosis of SARS-CoV-2 by FDA under an Emergency Use Authorization (EUA).  This EUA will remain in effect (meaning this test can be                           used) for the duration of  the  COVID-19 declaration under Section 564(b)(1) of the Act, 21 U.S.C. section 360bbb-3(b)(1), unless the authorization is terminated or revoked sooner.     Influenza A by PCR 01/24/2020 NEGATIVE  NEGATIVE Final   Influenza B by PCR 01/24/2020 NEGATIVE  NEGATIVE Final   Comment: (NOTE) The Xpert Xpress SARS-CoV-2/FLU/RSV plus assay is intended as an aid in the diagnosis of influenza from Nasopharyngeal swab specimens and should not be used as a sole basis for treatment. Nasal washings and aspirates are unacceptable for Xpert Xpress SARS-CoV-2/FLU/RSV testing.  Fact Sheet for Patients: BloggerCourse.com  Fact Sheet for Healthcare Providers: SeriousBroker.it  This test is not yet approved or cleared by the [place]  States FDA and has been authorized for detection and/or diagnosis of SARS-CoV-2 by FDA under an Emergency Use Authorization (EUA). This EUA will remain in effect (meaning this test can be used) for the duration of the COVID-19 declaration under Section 564(b)(1) of the Act, 21 U.S.C. section 360bbb-3(b)(1), unless the authorization is terminated or revoked.  Performed at [facility], [address] [place], [place] [address]     Allergies: Patient has no known allergies.  PTA Medications: (Not in a hospital admission)   Medical Decision Making  Patient reviewed with Dr. Bronwen Betters.  Inpatient psychiatric treatment recommended at this time.  Patient remains voluntary, agrees with plan to transition to inpatient psychiatric treatment. Current medications: -Acetaminophen 650 mg every 6 as needed/mouth pain -Maalox 30 mL oral every 4 as needed/digestion -Hydroxyzine 25 mg 3 times daily as needed/anxiety -Magnesium hydroxide 30 mL daily as needed/mild constipation -Trazodone 50 mg nightly as needed/sleep     Recommendations  Based on my evaluation the patient does not appear to have an emergency medical  condition.  Lenard Lance, FNP 11/11/20  2:24 PM

## 2020-11-11 NOTE — ED Notes (Addendum)
Pt admitted to continuous assessment unit pending transfer to [facility] tonight endorsing SI w/plan to drown self. Pt stated, "I attempted to drown myself but I couldn't go thru with it. 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 My life is going to shit right now". Pt crying hysterical throughout assessment and labwork. Currently on phone talking with mother, remains tearful. Meal and drink offered. Safety monitored.

## 2020-11-11 NOTE — Progress Notes (Addendum)
Pt's test results for a negative COVID-19 have been confirmed. Pt accepted to [facility] Room 306-2 at 2000.  Pt meets inpatient criteria per Doran Heater, FNP.  Treatment team communicated with via secure chat: Doran Heater, FNP, Rex Kras, RN, Sharion Dove, RN, [facility] AC-Danika Victory Dakin, RN, and [facility] Malva Limes, RN included in chat.   Maryjean Ka, MSW, [facility] 11/11/2020 6:27 PM

## 2020-11-12 ENCOUNTER — Encounter (HOSPITAL_COMMUNITY): Payer: Self-pay

## 2020-11-12 DIAGNOSIS — R45851 Suicidal ideations: Secondary | ICD-10-CM

## 2020-11-12 DIAGNOSIS — F322 Major depressive disorder, single episode, severe without psychotic features: Secondary | ICD-10-CM | POA: Diagnosis not present

## 2020-11-12 MED ORDER — SERTRALINE HCL 25 MG PO TABS
25.0000 mg | ORAL_TABLET | Freq: Every day | ORAL | Status: DC
  Filled 2020-11-12 (×2): qty 1

## 2020-11-12 MED ORDER — TRAZODONE HCL 50 MG PO TABS
50.0000 mg | ORAL_TABLET | Freq: Every evening | ORAL | Status: DC | PRN
  Administered 2020-11-12 – 2020-11-14 (×3): 50 mg via ORAL
  Filled 2020-11-12 (×3): qty 1

## 2020-11-12 MED ORDER — SERTRALINE HCL 25 MG PO TABS
25.0000 mg | ORAL_TABLET | Freq: Every day | ORAL | Status: DC
  Administered 2020-11-12 – 2020-11-13 (×2): 25 mg via ORAL
  Filled 2020-11-12 (×3): qty 1

## 2020-11-12 MED ORDER — ADULT MULTIVITAMIN W/MINERALS CH
1.0000 | ORAL_TABLET | Freq: Every day | ORAL | Status: DC
  Administered 2020-11-12 – 2020-11-15 (×4): 1 via ORAL
  Filled 2020-11-12 (×6): qty 1

## 2020-11-12 NOTE — H&P (Addendum)
Psychiatric Admission Assessment Adult  Patient Identification: Diana Phillips MRN:  [id number] Date of Evaluation:  11/12/2020 Chief Complaint:  MDD (major depressive disorder), single episode, severe (HCC) [F32.2] Principal Diagnosis: MDD (major depressive disorder), single episode, severe (HCC) Diagnosis:  Principal Problem:   MDD (major depressive disorder), single episode, severe (HCC)  History of Present Illness: Patient is a 25 year old female with no known psychiatric problems and no significant medical history presenting to [facility] from [facility] due to active suicidal ideation.  CHART REVIEW Per epic chart review this is patient's first psychiatric admission.  Of note on 10/26/2020 patient was at [facility] ED for punching a car window and presented with several lacerations to right hand.  Patient left prior to being assessed during that ED visit.  Patient was seen at [facility] after contacting suicide hotline due to having thoughts of wanting [place].  Patient stated that she attempted this 2 weeks ago but cannot stay under long enough to drown herself.  TODAY'S INTERVIEW Patient seen and staffed with Dr. Mason Jim.  Patient states that she has had a great deal of stressors recently including breaking up with her girlfriend, disappointment and career not going as she had intended.  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  The stress has been overwhelming for her in that she has not been drafted and she has been feeling more more depressed since she graduated last May.  Patient states that she had attempted to drown herself 2 weeks ago after feeling extremely depressed.  Patient states that she is getting poor sleep approximately 2 to 3 hours a day, depressed mood, low interest, anhedonia, poor concentration, poor energy, no appetite.  Patient states she has lost a lot of friendships due  to stress of trying to achieve her basketball career.  The symptoms have worsened for the past several months.  Patient has passive SI but is able to contract for safety while on the unit.  Patient denies HI/AVH.  Patient states that she occasionally sees shadows in the corner of her eye stating that she saw this a few days ago.  Patient does report having para suicidal behaviors including cutting herself as well as punching windows.  Patient states that she last punched a window 2 weeks ago.  Patient states that she has not cut herself in months but these were in an effort to deal with her stresses rather than attempting to kill her self.  Patient states that while at [facility] she received trazodone 50 mg for sleep.  Patient states that she was not able to fall asleep for a while but was able to finally fall asleep and stay asleep. Patient requesting antidepressant Zoloft for management of depression.  Encourage patient to attend group and be active during her stay on the unit.  Patient verbalizes agreement.  Patient denies manic symptoms including grandiosity, elevated mood, pressured speech.  Patient denies paranoia, delusions, frustrating symptoms, thought broadcasting.  Patient denies having ever experienced trauma including verbal, physical, sexual, witnessed or experienced.  Patient states that her last menstrual period was 2 days ago.  Patient states that she lives with her mom and her mom's husband and 2 brothers.  Patient is not currently employed.  Patient has no known medical problems.  Patient denies using substances including cocaine, heroin, marijuana.  Patient denies alcohol use.  Patient denies  tobacco use.  Patient has seen a counselor in the past for 2 years in college but has never seen a psychiatrist.  Per [facility] assessment, mother had stated that patient has experienced multiple "devastation's" and is having difficulty coping regarding these incidences.  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  Mother states that patient has been having trouble finding work which is also compounding her depression and hopelessness at this time.  Associated Signs/Symptoms: Depression Symptoms:  depressed mood, anhedonia, insomnia, psychomotor retardation, fatigue, feelings of worthlessness/guilt, difficulty concentrating, hopelessness, recurrent thoughts of death, anxiety, loss of energy/fatigue, disturbed sleep, Duration of Depression Symptoms: Greater than two weeks  (Hypo) Manic Symptoms:   None Anxiety Symptoms:   None Psychotic Symptoms:   None PTSD Symptoms: Negative Total Time spent with patient: 1 hour I personally spent 60 minutes on the unit in direct patient care. The direct patient care time included face-to-face time with the patient, reviewing the patient's chart, communicating with other professionals, and coordinating care. Greater than 50% of this time was spent in counseling or coordinating care with the patient regarding goals of hospitalization, psycho-education, and discharge planning needs.    Past Psychiatric History: none  Is the patient at risk to self? Yes.    Has the patient been a risk to self in the past 6 months? Yes.    Has the patient been a risk to self within the distant past? No.  Is the patient a risk to others? No.  Has the patient been a risk to others in the past 6 months? No.  Has the patient been a risk to others within the distant past? No.   Prior Inpatient Therapy:  no Prior Outpatient Therapy:  yes, 2 years with therapist in college  Alcohol Screening: 1. How often do you have a drink containing alcohol?: Never 2. How many drinks containing alcohol do you have on a typical day when you are drinking?: 1 or 2 3. How often do you have six or more drinks on one occasion?: Never AUDIT-C Score: 0 9. Have you or someone else been injured as a result of your drinking?: No 10. Has a relative or  friend or a doctor or another health worker been concerned about your drinking or suggested you cut down?: No Alcohol Use Disorder Identification Test Final Score (AUDIT): 0 Substance Abuse History in the last 12 months:  No. Consequences of Substance Abuse: Negative Previous Psychotropic Medications: No  Psychological Evaluations: No  Past Medical History: History reviewed. No pertinent past medical history. History reviewed. No pertinent surgical history. Family History: History reviewed. No pertinent family history. Family Psychiatric  History: none Tobacco Screening:  none Social History:  Social History   Substance and Sexual Activity  Alcohol Use No   Alcohol/week: 0.0 standard drinks     Social History   Substance and Sexual Activity  Drug Use No    Additional Social History: Marital status: Single Are you sexually active?: Yes What is your sexual orientation?: Homosexual Has your sexual activity been affected by drugs, alcohol, medication, or emotional stress?: No Does patient have children?: No  Allergies:  No Known Allergies Lab Results:  Results for orders placed or performed during the hospital encounter of 11/11/20 (from the past 48 hour(s))  Resp Panel by RT-PCR (Flu A&B, Covid) Nasopharyngeal Swab     Status: None   Collection Time: 11/11/20  2:48 PM   Specimen: Nasopharyngeal Swab; Nasopharyngeal(NP) swabs  in vial transport medium  Result Value Ref Range   SARS Coronavirus 2 by RT PCR NEGATIVE NEGATIVE    Comment: (NOTE) SARS-CoV-2 target nucleic acids are NOT DETECTED.  The SARS-CoV-2 RNA is generally detectable in upper respiratory specimens during the acute phase of infection. The lowest concentration of SARS-CoV-2 viral copies this assay can detect is 138 copies/mL. A negative result does not preclude SARS-Cov-2 infection and should not be used as the sole basis for treatment or other patient management decisions. A negative result may occur with   improper specimen collection/handling, submission of specimen other than nasopharyngeal swab, presence of viral mutation(s) within the areas targeted by this assay, and inadequate number of viral copies(<138 copies/mL). A negative result must be combined with clinical observations, patient history, and epidemiological information. The expected result is Negative.  Fact Sheet for Patients:  BloggerCourse.com  Fact Sheet for Healthcare Providers:  SeriousBroker.it  This test is no t yet approved or cleared by the [place] FDA and  has been authorized for detection and/or diagnosis of SARS-CoV-2 by FDA under an Emergency Use Authorization (EUA). This EUA will remain  in effect (meaning this test can be used) for the duration of the COVID-19 declaration under Section 564(b)(1) of the Act, 21 U.S.C.section 360bbb-3(b)(1), unless the authorization is terminated  or revoked sooner.       Influenza A by PCR NEGATIVE NEGATIVE   Influenza B by PCR NEGATIVE NEGATIVE    Comment: (NOTE) The Xpert Xpress SARS-CoV-2/FLU/RSV plus assay is intended as an aid in the diagnosis of influenza from Nasopharyngeal swab specimens and should not be used as a sole basis for treatment. Nasal washings and aspirates are unacceptable for Xpert Xpress SARS-CoV-2/FLU/RSV testing.  Fact Sheet for Patients: BloggerCourse.com  Fact Sheet for Healthcare Providers: SeriousBroker.it  This test is not yet approved or cleared by the [place] FDA and has been authorized for detection and/or diagnosis of SARS-CoV-2 by FDA under an Emergency Use Authorization (EUA). This EUA will remain in effect (meaning this test can be used) for the duration of the COVID-19 declaration under Section 564(b)(1) of the Act, 21 U.S.C. section 360bbb-3(b)(1), unless the authorization is terminated or revoked.  Performed at  [facility], [address], [place], [place] [address]   CBC with Differential/Platelet     Status: Abnormal   Collection Time: 11/11/20  2:48 PM  Result Value Ref Range   WBC 5.3 4.0 - 10.5 K/uL   RBC 4.46 3.87 - 5.11 MIL/uL   Hemoglobin 13.7 12.0 - 15.0 g/dL   HCT 60.4 54.0 - 98.1 %   MCV 88.8 80.0 - 100.0 fL   MCH 30.7 26.0 - 34.0 pg   MCHC 34.6 30.0 - 36.0 g/dL   RDW 19.1 (L) 47.8 - 29.5 %   Platelets 239 150 - 400 K/uL   nRBC 0.0 0.0 - 0.2 %   Neutrophils Relative % 82 %   Neutro Abs 4.4 1.7 - 7.7 K/uL   Lymphocytes Relative 11 %   Lymphs Abs 0.6 (L) 0.7 - 4.0 K/uL   Monocytes Relative 5 %   Monocytes Absolute 0.3 0.1 - 1.0 K/uL   Eosinophils Relative 1 %   Eosinophils Absolute 0.0 0.0 - 0.5 K/uL   Basophils Relative 1 %   Basophils Absolute 0.0 0.0 - 0.1 K/uL   Immature Granulocytes 0 %   Abs Immature Granulocytes 0.02 0.00 - 0.07 K/uL    Comment: Performed at [facility], [address] [place], [place] [address]  Comprehensive metabolic panel     Status: None   Collection Time: 11/11/20  2:48 PM  Result Value Ref Range   Sodium 141 135 - 145 mmol/L   Potassium 3.8 3.5 - 5.1 mmol/L   Chloride 107 98 - 111 mmol/L   CO2 24 22 - 32 mmol/L   Glucose, Bld 90 70 - 99 mg/dL    Comment: Glucose reference range applies only to samples taken after fasting for at least 8 hours.   BUN 11 6 - 20 mg/dL   Creatinine, Ser 6.04 0.44 - 1.00 mg/dL   Calcium 9.8 8.9 - 54.0 mg/dL   Total Protein 7.6 6.5 - 8.1 g/dL   Albumin 4.5 3.5 - 5.0 g/dL   AST 20 15 - 41 U/L   ALT 12 0 - 44 U/L   Alkaline Phosphatase 54 38 - 126 U/L   Total Bilirubin 1.2 0.3 - 1.2 mg/dL   GFR, Estimated >98 >11 mL/min    Comment: (NOTE) Calculated using the CKD-EPI Creatinine Equation (2021)    Anion gap 10 5 - 15    Comment: Performed at [facility], [address], [place], [place] [address]  Hemoglobin A1c     Status: Abnormal   Collection Time: 11/11/20  2:48 PM  Result Value  Ref Range   Hgb A1c MFr Bld 4.6 (L) 4.8 - 5.6 %    Comment: (NOTE) Pre diabetes:          5.7%-6.4%  Diabetes:              >6.4%  Glycemic control for   <7.0% adults with diabetes    Mean Plasma Glucose 85.32 mg/dL    Comment: Performed at [facility], [address] [place], [place] [address]  Magnesium     Status: None   Collection Time: 11/11/20  2:48 PM  Result Value Ref Range   Magnesium 1.7 1.7 - 2.4 mg/dL    Comment: Performed at [facility], [address], [place], [place] [address]  Ethanol     Status: None   Collection Time: 11/11/20  2:48 PM  Result Value Ref Range   Alcohol, Ethyl (B) <10 <10 mg/dL    Comment: (NOTE) Lowest detectable limit for serum alcohol is 10 mg/dL.  For medical purposes only. Performed at [facility], [address], [place], [place] [address]   Lipid panel     Status: Abnormal   Collection Time: 11/11/20  2:48 PM  Result Value Ref Range   Cholesterol 140 0 - 200 mg/dL   Triglycerides 37 <962 mg/dL   HDL 40 (L) >95 mg/dL   Total CHOL/HDL Ratio 3.5 RATIO   VLDL 7 0 - 40 mg/dL   LDL Cholesterol 93 0 - 99 mg/dL    Comment:        Total Cholesterol/HDL:CHD Risk Coronary Heart Disease Risk Table                     Men   Women  1/2 Average Risk   3.4   3.3  Average Risk       5.0   4.4  2 X Average Risk   9.6   7.1  3 X Average Risk  23.4   11.0        Use the calculated Patient Ratio above and the CHD Risk Table to determine the patient's CHD Risk.  ATP III CLASSIFICATION (LDL):  <100     mg/dL   Optimal  454-098  mg/dL   Near or Above                    Optimal  130-159  mg/dL   Borderline  119-147  mg/dL   High  >829     mg/dL   Very High Performed at [facility], [name] [place], [place] [address]   TSH     Status: None   Collection Time: 11/11/20  2:48 PM  Result Value Ref Range   TSH 0.995 0.350 - 4.500 uIU/mL    Comment: Performed by a 3rd Generation assay with a functional  sensitivity of <=0.01 uIU/mL. Performed at [facility], [address] [place], [place] [address]   POC SARS Coronavirus 2 Ag-ED - Nasal Swab     Status: Normal   Collection Time: 11/11/20  2:52 PM  Result Value Ref Range   SARS Coronavirus 2 Ag Negative Negative  POCT Urine Drug Screen - (ICup)     Status: Normal   Collection Time: 11/11/20  2:53 PM  Result Value Ref Range   POC Amphetamine UR None Detected NONE DETECTED (Cut Off Level 1000 ng/mL)   POC Secobarbital (BAR) None Detected NONE DETECTED (Cut Off Level 300 ng/mL)   POC Buprenorphine (BUP) None Detected NONE DETECTED (Cut Off Level 10 ng/mL)   POC Oxazepam (BZO) None Detected NONE DETECTED (Cut Off Level 300 ng/mL)   POC Cocaine UR None Detected NONE DETECTED (Cut Off Level 300 ng/mL)   POC Methamphetamine UR None Detected NONE DETECTED (Cut Off Level 1000 ng/mL)   POC Morphine None Detected NONE DETECTED (Cut Off Level 300 ng/mL)   POC Oxycodone UR None Detected NONE DETECTED (Cut Off Level 100 ng/mL)   POC Methadone UR None Detected NONE DETECTED (Cut Off Level 300 ng/mL)   POC Marijuana UR None Detected NONE DETECTED (Cut Off Level 50 ng/mL)  POC SARS Coronavirus 2 Ag     Status: None   Collection Time: 11/11/20  3:00 PM  Result Value Ref Range   SARSCOV2ONAVIRUS 2 AG NEGATIVE NEGATIVE    Comment: (NOTE) SARS-CoV-2 antigen NOT DETECTED.   Negative results are presumptive.  Negative results do not preclude SARS-CoV-2 infection and should not be used as the sole basis for treatment or other patient management decisions, including infection  control decisions, particularly in the presence of clinical signs and  symptoms consistent with COVID-19, or in those who have been in contact with the virus.  Negative results must be combined with clinical observations, patient history, and epidemiological information. The expected result is Negative.  Fact Sheet for Patients:  https://www.jennings-kim.com/  Fact Sheet for Healthcare Providers: https://alexander-rogers.biz/  This test is not yet approved or cleared by the [place] FDA and  has been authorized for detection and/or diagnosis of SARS-CoV-2 by FDA under an Emergency Use Authorization (EUA).  This EUA will remain in effect (meaning this test can be used) for the duration of  the COV ID-19 declaration under Section 564(b)(1) of the Act, 21 U.S.C. section 360bbb-3(b)(1), unless the authorization is terminated or revoked sooner.    Pregnancy, urine POC     Status: None   Collection Time: 11/11/20  3:00 PM  Result Value Ref Range   Preg Test, Ur NEGATIVE NEGATIVE    Comment:        THE SENSITIVITY OF THIS METHODOLOGY IS >24  mIU/mL     Blood Alcohol level:  Lab Results  Component Value Date   ETH <10 11/11/2020    Metabolic Disorder Labs:  Lab Results  Component Value Date   HGBA1C 4.6 (L) 11/11/2020   MPG 85.32 11/11/2020   No results found for: PROLACTIN Lab Results  Component Value Date   CHOL 140 11/11/2020   TRIG 37 11/11/2020   HDL 40 (L) 11/11/2020   CHOLHDL 3.5 11/11/2020   VLDL 7 11/11/2020   LDLCALC 93 11/11/2020    Current Medications: Current Facility-Administered Medications  Medication Dose Route Frequency Provider Last Rate Last Admin   acetaminophen (TYLENOL) tablet 650 mg  650 mg Oral Q6H PRN Lenard Lance, FNP       alum & mag hydroxide-simeth (MAALOX/MYLANTA) 200-200-20 MG/5ML suspension 30 mL  30 mL Oral Q4H PRN Lenard Lance, FNP       feeding supplement (ENSURE ENLIVE / ENSURE PLUS) liquid 237 mL  237 mL Oral BID BM Melbourne Abts W, PA-C   237 mL at 11/12/20 1458   hydrOXYzine (ATARAX/VISTARIL) tablet 25 mg  25 mg Oral TID PRN Lenard Lance, FNP   25 mg at 11/11/20 2224   magnesium hydroxide (MILK OF MAGNESIA) suspension 30 mL  30 mL Oral Daily PRN Lenard Lance, FNP       multivitamin with minerals tablet 1 tablet  1 tablet Oral  Daily Mason Jim, Ormand Senn E, MD   1 tablet at 11/12/20 0836   sertraline (ZOLOFT) tablet 25 mg  25 mg Oral QHS Comer Locket, MD       traZODone (DESYREL) tablet 50 mg  50 mg Oral QHS PRN,MR X 1 Park Pope, MD       PTA Medications: Medications Prior to Admission  Medication Sig Dispense Refill Last Dose   loratadine (CLARITIN) 10 MG tablet Take 10 mg by mouth daily.       Musculoskeletal: Strength & Muscle Tone: within normal limits Gait & Station: normal Patient leans: N/A   Psychiatric Specialty Exam:  Presentation  General Appearance: Appropriate for Environment; Casual  Eye Contact:Good  Speech:Normal Rate  Speech Volume:Decreased  Handedness:Right   Mood and Affect  Mood:Depressed; Anxious; Hopeless  Affect:Tearful; Depressed   Thought Process  Thought Processes:Coherent; Goal Directed  Duration of Psychotic Symptoms: Less than six months  Past Diagnosis of Schizophrenia or Psychoactive disorder: No  Descriptions of Associations:Intact  Orientation:Full (Time, Place and Person)  Thought Content:Reports occasional VH but denies AH, ideas of reference, first rank symptoms, or delusions  Hallucinations: visual Description of Visual Hallucinations: "Shadows in the corner of my eyes"  Ideas of Reference:None  Suicidal Thoughts: passive and can contract for safety on the unit - had SI with plan prior to admission  Homicidal Thoughts:Homicidal Thoughts: No   Sensorium  Memory:Immediate Good; Recent Good; Remote Good  Judgment:Fair  Insight:Fair   Executive Functions  Concentration:Good  Attention Span:Good  Recall:Good  Fund of Knowledge:Good  Language:Good   Psychomotor Activity  Psychomotor Activity:Psychomotor Activity: Normal   Assets  Assets:Communication Skills; Desire for Improvement; Financial Resources/Insurance; Housing; Leisure Time; Physical Health; Resilience; Social Support; Talents/Skills; Transportation   Sleep   Sleep:Sleep: Fair Number of Hours of Sleep: 5.75  Physical Exam Vitals and nursing note reviewed.  Constitutional:      Appearance: Normal appearance. She is normal weight.  HENT:     Head: Normocephalic and atraumatic.  Pulmonary:     Effort: Pulmonary effort is normal.  Neurological:  General: No focal deficit present.     Mental Status: She is oriented to person, place, and time.   Review of Systems  Respiratory:  Negative for shortness of breath.   Cardiovascular:  Negative for chest pain.  Gastrointestinal:  Negative for abdominal pain, constipation, diarrhea, heartburn, nausea and vomiting.  Neurological:  Negative for headaches.  Blood pressure 111/68, pulse (!) 135, temperature 98.3 F (36.8 C), temperature source Oral, height 5\' 6"  (1.676 m), weight 59.9 kg, last menstrual period 11/10/2020, SpO2 100 %. Body mass index is 21.31 kg/m.  Treatment Plan Summary: Daily contact with patient to assess and evaluate symptoms and progress in treatment and Medication management  ASSESSMENT Patient is a 25 year old female with no known psychiatric problems and no significant medical history presenting to [name] from [facility] due to active suicidal ideation.  Patient requesting Zoloft as antidepressant.  Encourage patient to attend group.  Patient appears tearful and depressed at this time.  Patient did express some benefit from trazodone for sleep.   Psychiatric Problems MDD Severe, Single Episode with questionable psychotic features -Zoloft 25 mg, plan to titrate up should patient tolerate medication.  Risk/benefits/side effects discussed with patient and patient is agreeable to medication trial. -Hydroxyzine 25 mg tid prn for anxiety -Trazodone 50 mg prn and x1 more time prn for insomnia - Monitor for signs of psychosis and start antipsychotic if indicated - no acute psychosis on exam today -Encourage patient to attend group therapy  Medical Problems Normal lipid panel, negative  pregnancy test, negative COVID PCR, normal TSH Prolactin pending  3. Safety and Monitoring: Voluntary admission to inpatient psychiatric unit for safety, stabilization and treatment Daily contact with patient to assess and evaluate symptoms and progress in treatment Patient's case to be discussed in multi-disciplinary team meeting Observation Level : q15 minute checks Vital signs: q12 hours Precautions: suicide, elopement, and assault   4. Discharge Planning: Social work and case management to assist with discharge planning and identification of hospital follow-up needs prior to discharge Estimated LOS: 5-7 days Discharge Concerns: Need to establish a safety plan; Medication compliance and effectiveness Discharge Goals: Return home with outpatient referrals for mental health follow-up including medication management/psychotherapy   Observation Level/Precautions:  15 minute checks  Laboratory:  CBC Chemistry Profile  Psychotherapy:    Medications:    Consultations:    Discharge Concerns:    Estimated LOS:  Other:     Physician Treatment Plan for Primary Diagnosis: MDD (major depressive disorder), single episode, severe (HCC) Long Term Goal(s): Improvement in symptoms so as ready for discharge  Short Term Goals: Ability to identify changes in lifestyle to reduce recurrence of condition will improve, Ability to verbalize feelings will improve, Ability to disclose and discuss suicidal ideas, Ability to demonstrate self-control will improve, Ability to identify and develop effective coping behaviors will improve, Ability to maintain clinical measurements within normal limits will improve, and Compliance with prescribed medications will improve  Physician Treatment Plan for Secondary Diagnosis: Principal Problem:   MDD (major depressive disorder), single episode, severe (HCC)  Long Term Goal(s): Improvement in symptoms so as ready for discharge  Short Term Goals: Ability to identify  changes in lifestyle to reduce recurrence of condition will improve, Ability to verbalize feelings will improve, Ability to disclose and discuss suicidal ideas, Ability to demonstrate self-control will improve, Ability to identify and develop effective coping behaviors will improve, Ability to maintain clinical measurements within normal limits will improve, Compliance with prescribed medications will improve, and  Ability to identify triggers associated with substance abuse/mental health issues will improve  I certify that inpatient services furnished can reasonably be expected to improve the patient's condition.    Park Pope, MD 10/14/20225:29 PM

## 2020-11-12 NOTE — BH IP Treatment Plan (Signed)
Interdisciplinary Treatment and Diagnostic Plan Update  11/12/2020 Time of Session: 1:50pm Diana Phillips MRN: [id number]  Principal Diagnosis: MDD (major depressive disorder), single episode, severe ([place])  Secondary Diagnoses: Principal Problem:   MDD (major depressive disorder), single episode, severe ([place])   Current Medications:  Current Facility-Administered Medications  Medication Dose Route Frequency Provider Last Rate Last Admin   acetaminophen (TYLENOL) tablet 650 mg  650 mg Oral Q6H PRN Lucky Rathke, FNP       alum & mag hydroxide-simeth (MAALOX/MYLANTA) 200-200-20 MG/5ML suspension 30 mL  30 mL Oral Q4H PRN Lucky Rathke, FNP       feeding supplement (ENSURE ENLIVE / ENSURE PLUS) liquid 237 mL  237 mL Oral BID BM Margorie John W, PA-C   237 mL at 11/12/20 0957   hydrOXYzine (ATARAX/VISTARIL) tablet 25 mg  25 mg Oral TID PRN Lucky Rathke, FNP   25 mg at 11/11/20 2224   magnesium hydroxide (MILK OF MAGNESIA) suspension 30 mL  30 mL Oral Daily PRN Lucky Rathke, FNP       multivitamin with minerals tablet 1 tablet  1 tablet Oral Daily Harlow Asa, MD   1 tablet at 11/12/20 0836   sertraline (ZOLOFT) tablet 25 mg  25 mg Oral QHS Nelda Marseille, Amy E, MD       traZODone (DESYREL) tablet 50 mg  50 mg Oral QHS PRN Lucky Rathke, FNP   50 mg at 11/11/20 2224   PTA Medications: Medications Prior to Admission  Medication Sig Dispense Refill Last Dose   loratadine (CLARITIN) 10 MG tablet Take 10 mg by mouth daily.       Patient Stressors: Financial difficulties   Loss of significant relationship-GF broke up with her   Occupational concerns    Patient Strengths: Average or above average intelligence  Capable of independent living  Motivation for treatment/growth  Physical Health  Supportive family/friends   Treatment Modalities: Medication Management, Group therapy, Case management,  1 to 1 session with clinician, Psychoeducation, Recreational therapy.   Physician Treatment  Plan for Primary Diagnosis: MDD (major depressive disorder), single episode, severe ([place]) Long Term Goal(s):     Short Term Goals:    Medication Management: Evaluate patient's response, side effects, and tolerance of medication regimen.  Therapeutic Interventions: 1 to 1 sessions, Unit Group sessions and Medication administration.  Evaluation of Outcomes: Not Met  Physician Treatment Plan for Secondary Diagnosis: Principal Problem:   MDD (major depressive disorder), single episode, severe ([place])  Long Term Goal(s):     Short Term Goals:       Medication Management: Evaluate patient's response, side effects, and tolerance of medication regimen.  Therapeutic Interventions: 1 to 1 sessions, Unit Group sessions and Medication administration.  Evaluation of Outcomes: Not Met   RN Treatment Plan for Primary Diagnosis: MDD (major depressive disorder), single episode, severe ([place]) Long Term Goal(s): Knowledge of disease and therapeutic regimen to maintain health will improve  Short Term Goals: Ability to remain free from injury will improve, Ability to verbalize frustration and anger appropriately will improve, Ability to demonstrate self-control, Ability to participate in decision making will improve, Ability to verbalize feelings will improve, Ability to disclose and discuss suicidal ideas, and Compliance with prescribed medications will improve  Medication Management: RN will administer medications as ordered by provider, will assess and evaluate patient's response and provide education to patient for prescribed medication. RN will report any adverse and/or side effects to prescribing provider.  Therapeutic Interventions: 1  on 1 counseling sessions, Psychoeducation, Medication administration, Evaluate responses to treatment, Monitor vital signs and CBGs as ordered, Perform/monitor CIWA, COWS, AIMS and Fall Risk screenings as ordered, Perform wound care treatments as ordered.  Evaluation  of Outcomes: Not Met   LCSW Treatment Plan for Primary Diagnosis: MDD (major depressive disorder), single episode, severe ([place]) Long Term Goal(s): Safe transition to appropriate next level of care at discharge, Engage patient in therapeutic group addressing interpersonal concerns.  Short Term Goals: Engage patient in aftercare planning with referrals and resources, Increase social support, Increase ability to appropriately verbalize feelings, Increase emotional regulation, Facilitate acceptance of mental health diagnosis and concerns, and Increase skills for wellness and recovery  Therapeutic Interventions: Assess for all discharge needs, 1 to 1 time with Social worker, Explore available resources and support systems, Assess for adequacy in community support network, Educate family and significant other(s) on suicide prevention, Complete Psychosocial Assessment, Interpersonal group therapy.  Evaluation of Outcomes: Not Met   Progress in Treatment: Attending groups: Yes. Participating in groups: Yes. Taking medication as prescribed: Yes. Toleration medication: Yes. Family/Significant other contact made: No, will contact:  mother Patient understands diagnosis: Yes. Discussing patient identified problems/goals with staff: Yes. Medical problems stabilized or resolved: Yes. Denies suicidal/homicidal ideation: Yes. Issues/concerns per patient self-inventory: No.   New problem(s) identified: No, Describe:  none  New Short Term/Long Term Goal(s): medication stabilization, elimination of SI thoughts, development of comprehensive mental wellness plan.    Patient Goals:  "To find better ways to cope"  Discharge Plan or Barriers: Patient recently admitted. CSW will continue to follow and assess for appropriate referrals and possible discharge planning.    Reason for Continuation of Hospitalization: Anxiety Depression Medication stabilization Suicidal ideation  Estimated Length of Stay:  3-5 days   Scribe for Treatment Team: Vassie Moselle, LCSW 11/12/2020 2:30 PM

## 2020-11-12 NOTE — BHH Group Notes (Signed)
The focus of this group is to help patients establish daily goals to achieve during treatment and discuss how the patient can incorporate goal setting into their daily lives to aide in recovery.  Pt attended morning goals and orientation group. Pt had great participation.  

## 2020-11-12 NOTE — Progress Notes (Signed)
Progress note  Pt found in bed; compliant with medication administration. Pt denies any physical complaints or pain. Pt presents sad, sullen, anxious and depressed. Pt is still endorsing passive si with no plan. Pt is minimal with avertive eye contact. Pt is pleasant though. Pt denies hi/ah/vh and verbally agrees to approach staff if these become apparent or before harming themselves/others while at bhh.  A: Pt provided support and encouragement. Pt given medication per protocol and standing orders. Q52m safety checks implemented and continued.  R: Pt safe on the unit. Will continue to monitor.

## 2020-11-12 NOTE — BHH Counselor (Addendum)
Adult Comprehensive Assessment  Patient ID: Diana Phillips, female   DOB: 1995/02/28, 25 y.o.   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" Employment / Job issues: Pt reports being unemployed Family Relationships: Pt reports no stressors Surveyor, quantity / Lack of resources (include bankruptcy): Pt reports mother helps her financially Housing / Lack of housing: Pt reports living with her mother and other family members Physical health (include injuries & life threatening diseases): Pt reports no stressors Social relationships: Pt reports few social relationships Substance abuse: Pt denies all substance use Bereavement / Loss: Pt reports no stressors  Living/Environment/Situation:  Living Arrangements: Parent, Other relatives Living conditions (as described by patient or guardian): Home/Family Who else lives in the home?: Mother, 2 brothers, mother's husband How long has patient lived in current situation?: 3 months What is atmosphere in current home: Comfortable, Supportive  Family History:  Marital status: Single Are you sexually active?: Yes What is your sexual orientation?: "Gay" Has your sexual activity been affected by drugs, alcohol, medication, or emotional stress?: No Does patient have children?: No  Childhood History:  By whom was/is the patient raised?: Both parents Description of patient's relationship with caregiver when they were a child: "We have a good relationship" Patient's description of current relationship with people who raised him/her: "We are still good" How were you disciplined when you got in trouble as  a child/adolescent?: Spankings and groundings Does patient have siblings?: Yes Number of Siblings: 4 Description of patient's current relationship with siblings: "I have 2 brothers and 2 sisters" Did patient suffer any verbal/emotional/physical/sexual abuse as a child?: No Did patient suffer from severe childhood neglect?: No Has patient ever been sexually abused/assaulted/raped as an adolescent or adult?: No Was the patient ever a victim of a crime or a disaster?: No Witnessed domestic violence?: No Has patient been affected by domestic violence as an adult?: No  Education:  Highest grade of school patient has completed: 12th grade, Master Degree in Surveyor, minerals Currently a student?: No Learning disability?: No  Employment/Work Situation:   Employment Situation: Unemployed Patient's Job has Been Impacted by Current Illness: No What is the Longest Time Patient has Held a Job?: N/A Where was the Patient Employed at that Time?: Pt reports she has never had a job Has Patient ever Been in the [employer]?: No  Financial Resources:   Surveyor, quantity resources: Support from parents / caregiver, Medicaid Does patient have a Lawyer or guardian?: No  Alcohol/Substance Abuse:   What has been your use of drugs/alcohol within the last 12 months?: Pt denies all substance use If attempted suicide, did drugs/alcohol play a role in this?: No Alcohol/Substance Abuse Treatment Hx: Denies past history Has alcohol/substance abuse ever caused legal problems?: No  Social Support System:   Conservation officer, nature Support System: Fair Development worker, community Support System: Mother and siblings Type of faith/religion: Ephriam Knuckles How does patient's faith help to cope with current illness?: Prayer  Leisure/Recreation:   Do You Have Hobbies?: Yes Leisure and Hobbies: Basketball  Strengths/Needs:   What is the patient's perception of their strengths?: Basketball and drawing Patient states they can use  these personal strengths during their treatment to  contribute to their recovery: "I can draw my feelings and it takes my mind off things and if I am in the gym it clears my mind" Patient states these barriers may affect/interfere with their treatment: None Patient states these barriers may affect their return to the community: None Other important information patient would like considered in planning for their treatment: None  Discharge Plan:   Currently receiving community mental health services: No Patient states concerns and preferences for aftercare planning are: Pt is interested in therapy and medication management Patient states they will know when they are safe and ready for discharge when: "When I have medications and therapy" Does patient have access to transportation?: Yes (Pt reports having her own car at home) Does patient have financial barriers related to discharge medications?: Yes Patient description of barriers related to discharge medications: Limited income from mother Will patient be returning to same living situation after discharge?: Yes  Summary/Recommendations:   Summary and Recommendations (to be completed by the evaluator): Diana Phillips is a 25 year old, female, who was admitted to the hopsital due to depression and suicidal thoughts.  The Pt reports that recently she has been having difficulties coping with hings and has been "hard" on herself when she believes she has failed at something.  The Pt reports that she is living with her mother, 2 brothers, and her mother's husband.  She states denies any family conflict or any childhood trauma.  The Pt reports that she has been attempting to play basketball professionally but does not believe this has been going well.  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  She states that after her return home her girlfriend broke up with her and she has  taken this break-up harder then expected.  The Pt reports that she has no other employment experience and that her mother is her financial support.  The Pt denies all current or previous substance use or substance use treatment.  While in the hospital the Pt can benefit from crisis stabilization, medication evaluation, group therapy, psycho-education, case management, and discharge planning.  Upon discharge the Pt would like to return to her mother's home and follow-up with [facility] for therapy and psychiatry.  [name]. 11/12/2020

## 2020-11-12 NOTE — Progress Notes (Signed)
Patient did attend the evening speaker AA meeting. Pt was attentive, supportive, and engaged.  

## 2020-11-12 NOTE — Plan of Care (Signed)
  Problem: Education: Goal: Knowledge of [facility] Education information/materials will improve Outcome: Progressing Goal: Emotional status will improve Outcome: Progressing Goal: Mental status will improve Outcome: Progressing   

## 2020-11-12 NOTE — BHH Suicide Risk Assessment (Signed)
[facility] Admission Suicide Risk Assessment   Nursing information obtained from:  Patient Demographic factors:  Gay, lesbian, or bisexual orientation, Unemployed, Adolescent or young adult Current Mental Status:  Suicidal ideation indicated by patient Loss Factors:  Financial problems / change in socioeconomic status, Loss of significant relationship Historical Factors:  Prior suicide attempts (pt tried to drown herself 2 weeks ago) Risk Reduction Factors:  Living with another person, especially a relative, Positive social support  Total Time Spent in Direct Patient Care:  I personally spent 45 minutes on the unit in direct patient care. The direct patient care time included face-to-face time with the patient, reviewing the patient's chart, communicating with other professionals, and coordinating care. Greater than 50% of this time was spent in counseling or coordinating care with the patient regarding goals of hospitalization, psycho-education, and discharge planning needs.  Principal Problem: MDD (major depressive disorder), single episode, severe ([facility]) Diagnosis:  Principal Problem:   MDD (major depressive disorder), single episode, severe ([facility])  Subjective Data: The patient is a 25y/o female with no known past psychiatric history, who presented to [facility] on 11/11/20 after contacting a suicide hotline. She presented voluntarily for treatment accompanied by her mother at intake. On assessment, she reported SI with thoughts of driving her car into a lake and reported that she had attempted to drown herself in a pond approximately 2 weeks prior. She was transferred voluntarily to [facility] for continued inpatient stabilization.   On assessment, patient states that she has felt progressively more depressed over the last few months. 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 She instead  was playing professionally in [place] until she got sick and dehydrated and had to quit the team and return home this summer. 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 Additionally, she states she had been in a relationship with her female partner for 2 1/2 years and they recently broke up just prior to this admission. She reports that her partner is also a basketball player and was her "best friend." She reports that in the last several months it has been an effort to get out of bed or attend to her ADLs. She has had poor sleep, ruminations, sense of guilt, low energy, poor focus, low appetite, and anhedonia. She admits that several weeks ago she attempted to walk into a lake in a suicide attempt but stopped herself. She states this was her only previous suicide attempt. She denies previous psychotropic medication trials, previous inpatient admissions, or previous diagnoses/therapy. She denies h/o mania/hypomania. She states she had a sense of "seeing shadows" behind her several weeks ago but denies current VH and denies AH, paranoia, ideas of reference, or first rank symptoms. She denies drug or alcohol history. She denies PMH. See H&P for additional details.  Continued Clinical Symptoms:  Alcohol Use Disorder Identification Test Final Score (AUDIT): 0 The "Alcohol Use Disorders Identification Test", Guidelines for Use in Primary Care, Second Edition.  World Science writer [facility]). Score between 0-7:  no or low risk or alcohol related problems. Score between 8-15:  moderate risk of alcohol related problems. Score between 16-19:  high risk of alcohol related problems. Score 20 or above:  warrants further diagnostic evaluation for alcohol dependence  and treatment.  CLINICAL FACTORS:   Depression:    Anhedonia Hopelessness Impulsivity Insomnia Severe  Musculoskeletal: Strength & Muscle Tone: within normal limits Gait & Station: normal Patient leans: N/A Psychiatric Specialty Exam: Physical Exam Vitals reviewed.  HENT:     Head: Normocephalic.  Pulmonary:     Effort: Pulmonary effort is normal.  Neurological:     General: No focal deficit present.     Mental Status: She is alert.    Review of Systems - see H&P  Blood pressure (!) 122/93, pulse 97, temperature 98.3 F (36.8 C), temperature source Oral, height 5\' 6"  (1.676 m), weight 59.9 kg, last menstrual period 11/10/2020, SpO2 100 %.Body mass index is 21.31 kg/m.  General Appearance:  casually dressed, adequate hygiene  Eye Contact:  Fair  Speech:  Clear and Coherent and Normal Rate  Volume:  Decreased  Mood:  Anxious and Depressed  Affect:  Restricted and Tearful  Thought Process:  Goal Directed and Linear  Orientation:  Full (Time, Place, and Person)  Thought Content:   reports recent VH but denies AH, ideas of reference, paranoia, first rank symptoms; no delusions noted  Suicidal Thoughts:  Yes.  with intent/plan to drown self prior to admission  Homicidal Thoughts:  No  Memory:  Recent;   Good  Judgement:  Fair  Insight:  Fair  Psychomotor Activity:  Normal  Concentration:  Concentration: Good and Attention Span: Good  Recall:  Good  Fund of Knowledge:  Good  Language:  Good  Akathisia:  Negative  Assets:  Communication Skills Desire for Improvement Housing Physical Health Resilience Social Support  ADL's:  Intact  Cognition:  WNL  Sleep:  Number of Hours: 5.75    COGNITIVE FEATURES THAT CONTRIBUTE TO RISK:  None    SUICIDE RISK:   Moderate:  Frequent suicidal ideation with limited intensity, and duration, some specificity in terms of plans, no associated intent, good self-control, limited dysphoria/symptomatology, some risk factors present, and identifiable protective factors, including available  and accessible social support.  PLAN OF CARE: Patient admitted voluntarily to [facility]. Admission labs reviewed: respiratory panel and SARS negative; UDS negative; TSH 0.995, Lipid panel WNL except for HDL 40; ETOH <10, Mag 1.7; A1c 4.6; UPT negative; CMP WNL, CBC WBC 5.3, H/H 13.7/39.6, platelets 239; EKG shows sinus rhythm with sinus arrhythmia 90bpm with QTC 10[birth year]1988.  House Officer discussed antidepressant options with the patient and she agreed to trial of Zoloft. I reviewed the specific r/b/se/a to Zoloft and she consents to med trial. I reviewed the FDA black box warning for risk of SI in patients in her age range who are started on antidepressants. She will be started on 25mg  qhs titrating up as tolerated. She would benefit from psychotherapy after discharge. Team will work on safety and discharge planning with her mother with patient's consent.  I certify that inpatient services furnished can reasonably be expected to improve the patient's condition.   , MD, FAPA 11/12/2020, 1:59 PM

## 2020-11-12 NOTE — Progress Notes (Signed)
NUTRITION ASSESSMENT RD working remotely.  Pt identified as at risk on the Malnutrition Screen Tool  INTERVENTION: - continue Ensure Plus BID, each supplement provides 350 kcal and 13 grams of protein. [facility] staff to continue to encourage PO intakes of meals, snacks, and supplements.   NUTRITION DIAGNOSIS: Unintentional weight loss related to sub-optimal intake as evidenced by pt report.   Goal: Pt to meet >/= 90% of their estimated nutrition needs.  Monitor:  PO intake  Assessment:  Patient was admitted due to severe MDD with psychotic features and SI with a plan to drown herself in a lake. She reported auditory hallucinations.   She reported decreased ability to sleep and decreased appetite and intakes since 05/2020 and reported that she has lost 35 lb since that time.   Weight yesterday was documented as 132 lb; appears to possibly be a stated weight. PTA the most recently documented weight was on 01/24/20 when she weighed 169 lb.  This indicates 37 lb weight loss (22% body weight) in the past 10 months; significant for time frame.   Ensure was ordered BID at the time of admission per ONS protocol.     25 y.o. female  Height: Ht Readings from Last 1 Encounters:  11/11/20 5\' 6"  (1.676 m)    Weight: Wt Readings from Last 1 Encounters:  11/11/20 59.9 kg    Weight Hx: Wt Readings from Last 10 Encounters:  11/11/20 59.9 kg  10/26/20 61.2 kg  01/24/20 77 kg  07/05/15 65.8 kg (81 %, Z= 0.87)*  03/10/15 63.5 kg (77 %, Z= 0.73)*  01/01/15 67.1 kg (84 %, Z= 1.00)*  06/10/14 65.8 kg (83 %, Z= 0.95)*  05/25/14 65.8 kg (83 %, Z= 0.95)*  03/22/14 69.9 kg (89 %, Z= 1.22)*  02/16/14 63.5 kg (79 %, Z= 0.82)*   * Growth percentiles are based on CDC (Girls, 2-20 Years) data.    BMI:  Body mass index is 21.31 kg/m. Pt meets criteria for normal weight based on current BMI.  Estimated Nutritional Needs: Kcal: 25-30 kcal/kg Protein: > 1 gram protein/kg Fluid: 1  ml/kcal  Diet Order:  Diet Order             Diet regular Room service appropriate? Yes; Fluid consistency: Thin  Diet effective now                  Pt is also offered choice of unit snacks mid-morning and mid-afternoon.  Pt is eating as desired.   Lab results and medications reviewed.     02/18/14, MS, RD, LDN, CNSC Inpatient Clinical Dietitian RD pager # available in [facility]  After hours/weekend pager # available in [facility]

## 2020-11-12 NOTE — Group Note (Signed)
LCSW Group Therapy Note   Group Date: 11/12/2020 Start Time: 1300 End Time: 1400   Type of Therapy and Topic:  Group Therapy:   Participation Level:  Active  Summary of Progress/Problems: Diana Phillips states that she is grateful for her family. Pt received the packet for group. Pt followed along throughout the group and discussed things to be grateful for with their peers.  Pt interacted appropriately and remained on topic.  [name], LCSWA 11/12/2020  1:52 PM

## 2020-11-12 NOTE — [facility] Suicide Risk Assessment (Signed)
[facility] INPATIENT:  Family/Significant Other Suicide Prevention Education  Suicide Prevention Education:  Education Completed; Carles Collet [phone] (Mother) has been identified by the patient as the family member/significant other with whom the patient will be residing, and identified as the person(s) who will aid the patient in the event of a mental health crisis (suicidal ideations/suicide attempt).  With written consent from the patient, the family member/significant other has been provided the following suicide prevention education, prior to the and/or following the discharge of the patient.  The suicide prevention education provided includes the following: Suicide risk factors Suicide prevention and interventions National Suicide Hotline telephone number [facility] assessment telephone number [facility] Emergency Assistance 911 [facility] and/or Residential Mobile Crisis Unit telephone number  Request made of family/significant other to: Remove weapons (e.g., guns, rifles, knives), all items previously/currently identified as safety concern.   Remove drugs/medications (over-the-counter, prescriptions, illicit drugs), all items previously/currently identified as a safety concern.  The family member/significant other verbalizes understanding of the suicide prevention education information provided.  The family member/significant other agrees to remove the items of safety concern listed above.  CSW spoke with Mrs. 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  She states that she has no concerns at this time and feels that her daughter will be safe to come home with her.  Mrs. Cleophas Dunker would like her daughter to have counseling.  She states that there are no firearms or weapons in the home.  CSW completed SPE with Mrs. Whitfield.    Metro Kung [place]ra Rogacki 11/12/2020, 3:40 PM

## 2020-11-13 DIAGNOSIS — F322 Major depressive disorder, single episode, severe without psychotic features: Secondary | ICD-10-CM | POA: Diagnosis not present

## 2020-11-13 LAB — PROLACTIN: Prolactin: 12.1 ng/mL (ref 4.8–23.3)

## 2020-11-13 NOTE — [facility] Group Notes (Signed)
Date:  11/13/2020 Time:  2:01 PM   Group Topic/Focus:  Emotional Education:   The focus of this group is to discuss what feelings/emotions are, and how they are experienced.   Participation Level:  Active   Participation Quality:  Appropriate   Affect:  Appropriate   Cognitive:  Appropriate   Insight: Appropriate   Engagement in Group:  Engaged   Modes of Intervention:  Education   Additional Comments:  Pt attended group and was very active. 

## 2020-11-13 NOTE — Progress Notes (Signed)
D: Patient states she slept well last night; her appetite is fair, and her concentration is good. She denies any thoughts of self harm today. She is observed in the day room. She has little to no interaction with her peers. She rates her depression as a 9, and her hopelessness as an 8. Her goal today is to "stay active and focus on happier things." She is compliant with her medications. She signed a 72-hour discharge form this morning. MD was notified of same. Copy is on shadow chart.  A: Continue to monitor medication management and MD orders.  Safety checks completed every 15 minutes per protocol.  Offer support and encouragement as needed.  R: Patient is receptive to staff; she is withdrawn and isolative.  11/13/20 1500  Psych Admission Type (Psych Patients Only)  Admission Status Voluntary  Psychosocial Assessment  Patient Complaints Anxiety;Depression  Eye Contact Brief  Facial Expression Sullen;Sad  Affect Depressed;Anxious  Speech Logical/coherent  Interaction Forwards little;Guarded  Motor Activity Other (Comment) (wnl)  Appearance/Hygiene Unremarkable  Behavior Characteristics Cooperative  Mood Depressed  Thought Process  Coherency Circumstantial  Content WDL  Delusions None reported or observed  Perception WDL  Hallucination None reported or observed  Judgment Poor  Confusion None  Danger to Self  Current suicidal ideation? Denies  Danger to Others  Danger to Others None reported or observed

## 2020-11-13 NOTE — [facility] Group Notes (Signed)
Pt attended Relaxation Group. Pt enjoyed watching college football and she had a great time talking to other pts as well.

## 2020-11-13 NOTE — Progress Notes (Signed)
[facility] MD Progress Note  11/13/2020 6:29 AM Katja Cocks  MRN:  [id number]  Chief Complaint: SI  Subjective: Diana Phillips is a 25 y.o. female with no known past psychiatric history, who was initially admitted for inpatient psychiatric hospitalization on 11/11/2020 for management of worsening depression and SI with thoughts of drowning herself. The patient is currently on Hospital Day 2.   Chart Review from last 24 hours:  The patient's chart was reviewed and nursing notes were reviewed. The patient's case was discussed in multidisciplinary team meeting. Per nursing patient had no acute behavioral issues or safety concerns and attended some groups. Per MAR she was compliant with scheduled medications and received Trazodone PRN X1 for sleep.  Information Obtained Today During Patient Interview: The patient was seen and evaluated on the unit. On assessment today the patient reports that she slept pretty well overnight but has not had breakfast this morning. She did eat some meals yesterday and reports that she is attending to ADLs. She visited with her mother last night and feels the meeting was productive. She denies SI, HI, AVH, paranoia, delusions, ideas of reference or first rank symptoms. She is tearful today and states she does not want to be in the hospital. We discussed the 72 hour request for discharge that she can sign, but I advised that since she is still mood labile, she just had a suicide attempt, has just started psychotropic medications, and is not showing forward thinking yet, that I do not feel safe discharging her this weekend. We discussed potentially titrating up on Zoloft tomorrow and she now questions if she wants to remain on medications. She is willing to do therapy after discharge and was encouraged to consider PHP/IOP after discharge. She was encouraged to work on goal setting and journaling today and to attend groups.  Supportive therapy provided.  Principal Problem: MDD (major  depressive disorder), single episode, severe (HCC) Diagnosis: Principal Problem:   MDD (major depressive disorder), single episode, severe (HCC) Active Problems:   Suicidal ideation  Total Time Spent in Direct Patient Care:  I personally spent 30 minutes on the unit in direct patient care. The direct patient care time included face-to-face time with the patient, reviewing the patient's chart, communicating with other professionals, and coordinating care. Greater than 50% of this time was spent in counseling or coordinating care with the patient regarding goals of hospitalization, psycho-education, and discharge planning needs.  Past Psychiatric History: see H&P  Past Medical History: Denied  Family History: see H&P  Family Psychiatric  History: see H&P  Social History:  Social History   Substance and Sexual Activity  Alcohol Use No   Alcohol/week: 0.0 standard drinks     Social History   Substance and Sexual Activity  Drug Use No    Social History   Socioeconomic History   Marital status: Single    Spouse name: Not on file   Number of children: Not on file   Years of education: Not on file   Highest education level: Bachelor's degree (e.g., BA, AB, BS)  Occupational History   Not on file  Tobacco Use   Smoking status: Never   Smokeless tobacco: Never  Vaping Use   Vaping Use: Never used  Substance and Sexual Activity   Alcohol use: No    Alcohol/week: 0.0 standard drinks   Drug use: No   Sexual activity: Yes    Birth control/protection: None  Other Topics Concern   Not on file  Social History  Narrative   Not on file   Social Determinants of Health   Financial Resource Strain: Not on file  Food Insecurity: Not on file  Transportation Needs: Not on file  Physical Activity: Not on file  Stress: Not on file  Social Connections: Not on file   Additional Social History:  Recent ending of lesbian relationship with partner of 2 1/2 years, living with mother,  unemployed  Sleep: Fair  Appetite:  Fair  Current Medications: Current Facility-Administered Medications  Medication Dose Route Frequency Provider Last Rate Last Admin   acetaminophen (TYLENOL) tablet 650 mg  650 mg Oral Q6H PRN Lenard Lance, FNP       alum & mag hydroxide-simeth (MAALOX/MYLANTA) 200-200-20 MG/5ML suspension 30 mL  30 mL Oral Q4H PRN Lenard Lance, FNP       feeding supplement (ENSURE ENLIVE / ENSURE PLUS) liquid 237 mL  237 mL Oral BID BM Melbourne Abts W, PA-C   237 mL at 11/12/20 1458   hydrOXYzine (ATARAX/VISTARIL) tablet 25 mg  25 mg Oral TID PRN Lenard Lance, FNP   25 mg at 11/11/20 2224   magnesium hydroxide (MILK OF MAGNESIA) suspension 30 mL  30 mL Oral Daily PRN Lenard Lance, FNP       multivitamin with minerals tablet 1 tablet  1 tablet Oral Daily Comer Locket, MD   1 tablet at 11/12/20 0836   sertraline (ZOLOFT) tablet 25 mg  25 mg Oral QHS Mason Jim, Lunden Stieber E, MD   25 mg at 11/12/20 2136   traZODone (DESYREL) tablet 50 mg  50 mg Oral QHS PRN,MR X 1 Park Pope, MD   50 mg at 11/12/20 2233    Lab Results:  Results for orders placed or performed during the hospital encounter of 11/11/20 (from the past 48 hour(s))  Resp Panel by RT-PCR (Flu A&B, Covid) Nasopharyngeal Swab     Status: None   Collection Time: 11/11/20  2:48 PM   Specimen: Nasopharyngeal Swab; Nasopharyngeal(NP) swabs in vial transport medium  Result Value Ref Range   SARS Coronavirus 2 by RT PCR NEGATIVE NEGATIVE    Comment: (NOTE) SARS-CoV-2 target nucleic acids are NOT DETECTED.  The SARS-CoV-2 RNA is generally detectable in upper respiratory specimens during the acute phase of infection. The lowest concentration of SARS-CoV-2 viral copies this assay can detect is 138 copies/mL. A negative result does not preclude SARS-Cov-2 infection and should not be used as the sole basis for treatment or other patient management decisions. A negative result may occur with  improper specimen  collection/handling, submission of specimen other than nasopharyngeal swab, presence of viral mutation(s) within the areas targeted by this assay, and inadequate number of viral copies(<138 copies/mL). A negative result must be combined with clinical observations, patient history, and epidemiological information. The expected result is Negative.  Fact Sheet for Patients:  BloggerCourse.com  Fact Sheet for Healthcare Providers:  SeriousBroker.it  This test is no t yet approved or cleared by the [place] FDA and  has been authorized for detection and/or diagnosis of SARS-CoV-2 by FDA under an Emergency Use Authorization (EUA). This EUA will remain  in effect (meaning this test can be used) for the duration of the COVID-19 declaration under Section 564(b)(1) of the Act, 21 U.S.C.section 360bbb-3(b)(1), unless the authorization is terminated  or revoked sooner.       Influenza A by PCR NEGATIVE NEGATIVE   Influenza B by PCR NEGATIVE NEGATIVE    Comment: (NOTE) The Xpert Xpress  SARS-CoV-2/FLU/RSV plus assay is intended as an aid in the diagnosis of influenza from Nasopharyngeal swab specimens and should not be used as a sole basis for treatment. Nasal washings and aspirates are unacceptable for Xpert Xpress SARS-CoV-2/FLU/RSV testing.  Fact Sheet for Patients: BloggerCourse.com  Fact Sheet for Healthcare Providers: SeriousBroker.it  This test is not yet approved or cleared by the [place] FDA and has been authorized for detection and/or diagnosis of SARS-CoV-2 by FDA under an Emergency Use Authorization (EUA). This EUA will remain in effect (meaning this test can be used) for the duration of the COVID-19 declaration under Section 564(b)(1) of the Act, 21 U.S.C. section 360bbb-3(b)(1), unless the authorization is terminated or revoked.  Performed at [facility]  Lab, [address], [place], [place] [address]   CBC with Differential/Platelet     Status: Abnormal   Collection Time: 11/11/20  2:48 PM  Result Value Ref Range   WBC 5.3 4.0 - 10.5 K/uL   RBC 4.46 3.87 - 5.11 MIL/uL   Hemoglobin 13.7 12.0 - 15.0 g/dL   HCT 94.8 54.6 - 27.0 %   MCV 88.8 80.0 - 100.0 fL   MCH 30.7 26.0 - 34.0 pg   MCHC 34.6 30.0 - 36.0 g/dL   RDW 35.0 (L) 09.3 - 81.8 %   Platelets 239 150 - 400 K/uL   nRBC 0.0 0.0 - 0.2 %   Neutrophils Relative % 82 %   Neutro Abs 4.4 1.7 - 7.7 K/uL   Lymphocytes Relative 11 %   Lymphs Abs 0.6 (L) 0.7 - 4.0 K/uL   Monocytes Relative 5 %   Monocytes Absolute 0.3 0.1 - 1.0 K/uL   Eosinophils Relative 1 %   Eosinophils Absolute 0.0 0.0 - 0.5 K/uL   Basophils Relative 1 %   Basophils Absolute 0.0 0.0 - 0.1 K/uL   Immature Granulocytes 0 %   Abs Immature Granulocytes 0.02 0.00 - 0.07 K/uL    Comment: Performed at [facility], [address], [place], [place] [address]  Comprehensive metabolic panel     Status: None   Collection Time: 11/11/20  2:48 PM  Result Value Ref Range   Sodium 141 135 - 145 mmol/L   Potassium 3.8 3.5 - 5.1 mmol/L   Chloride 107 98 - 111 mmol/L   CO2 24 22 - 32 mmol/L   Glucose, Bld 90 70 - 99 mg/dL    Comment: Glucose reference range applies only to samples taken after fasting for at least 8 hours.   BUN 11 6 - 20 mg/dL   Creatinine, Ser 1.69 0.44 - 1.00 mg/dL   Calcium 9.8 8.9 - 67.8 mg/dL   Total Protein 7.6 6.5 - 8.1 g/dL   Albumin 4.5 3.5 - 5.0 g/dL   AST 20 15 - 41 U/L   ALT 12 0 - 44 U/L   Alkaline Phosphatase 54 38 - 126 U/L   Total Bilirubin 1.2 0.3 - 1.2 mg/dL   GFR, Estimated >93 >81 mL/min    Comment: (NOTE) Calculated using the CKD-EPI Creatinine Equation (2021)    Anion gap 10 5 - 15    Comment: Performed at [facility], [address] [place], [place] [address]  Hemoglobin A1c     Status: Abnormal   Collection Time: 11/11/20  2:48 PM  Result Value Ref Range   Hgb A1c  MFr Bld 4.6 (L) 4.8 - 5.6 %    Comment: (NOTE) Pre diabetes:  5.7%-6.4%  Diabetes:              >6.4%  Glycemic control for   <7.0% adults with diabetes    Mean Plasma Glucose 85.32 mg/dL    Comment: Performed at [facility], [address] [place], [place] [address]  Magnesium     Status: None   Collection Time: 11/11/20  2:48 PM  Result Value Ref Range   Magnesium 1.7 1.7 - 2.4 mg/dL    Comment: Performed at [facility], [address] [place], [place] [address]  Ethanol     Status: None   Collection Time: 11/11/20  2:48 PM  Result Value Ref Range   Alcohol, Ethyl (B) <10 <10 mg/dL    Comment: (NOTE) Lowest detectable limit for serum alcohol is 10 mg/dL.  For medical purposes only. Performed at [facility], [address] [place], [place] [address]   Lipid panel     Status: Abnormal   Collection Time: 11/11/20  2:48 PM  Result Value Ref Range   Cholesterol 140 0 - 200 mg/dL   Triglycerides 37 <914 mg/dL   HDL 40 (L) >78 mg/dL   Total CHOL/HDL Ratio 3.5 RATIO   VLDL 7 0 - 40 mg/dL   LDL Cholesterol 93 0 - 99 mg/dL    Comment:        Total Cholesterol/HDL:CHD Risk Coronary Heart Disease Risk Table                     Men   Women  1/2 Average Risk   3.4   3.3  Average Risk       5.0   4.4  2 X Average Risk   9.6   7.1  3 X Average Risk  23.4   11.0        Use the calculated Patient Ratio above and the CHD Risk Table to determine the patient's CHD Risk.        ATP III CLASSIFICATION (LDL):  <100     mg/dL   Optimal  295-621  mg/dL   Near or Above                    Optimal  130-159  mg/dL   Borderline  308-657  mg/dL   High  >846     mg/dL   Very High Performed at [facility], [address], [place], [place] [address]   TSH     Status: None   Collection Time: 11/11/20  2:48 PM  Result Value Ref Range   TSH 0.995 0.350 - 4.500 uIU/mL    Comment: Performed by a 3rd Generation assay with a functional sensitivity of  <=0.01 uIU/mL. Performed at [facility], [address], [place], [place] [address]   POC SARS Coronavirus 2 Ag-ED - Nasal Swab     Status: Normal   Collection Time: 11/11/20  2:52 PM  Result Value Ref Range   SARS Coronavirus 2 Ag Negative Negative  POCT Urine Drug Screen - (ICup)     Status: Normal   Collection Time: 11/11/20  2:53 PM  Result Value Ref Range   POC Amphetamine UR None Detected NONE DETECTED (Cut Off Level 1000 ng/mL)   POC Secobarbital (BAR) None Detected NONE DETECTED (Cut Off Level 300 ng/mL)   POC Buprenorphine (BUP) None Detected NONE DETECTED (Cut Off Level 10 ng/mL)   POC Oxazepam (BZO) None Detected NONE  DETECTED (Cut Off Level 300 ng/mL)   POC Cocaine UR None Detected NONE DETECTED (Cut Off Level 300 ng/mL)   POC Methamphetamine UR None Detected NONE DETECTED (Cut Off Level 1000 ng/mL)   POC Morphine None Detected NONE DETECTED (Cut Off Level 300 ng/mL)   POC Oxycodone UR None Detected NONE DETECTED (Cut Off Level 100 ng/mL)   POC Methadone UR None Detected NONE DETECTED (Cut Off Level 300 ng/mL)   POC Marijuana UR None Detected NONE DETECTED (Cut Off Level 50 ng/mL)  POC SARS Coronavirus 2 Ag     Status: None   Collection Time: 11/11/20  3:00 PM  Result Value Ref Range   SARSCOV2ONAVIRUS 2 AG NEGATIVE NEGATIVE    Comment: (NOTE) SARS-CoV-2 antigen NOT DETECTED.   Negative results are presumptive.  Negative results do not preclude SARS-CoV-2 infection and should not be used as the sole basis for treatment or other patient management decisions, including infection  control decisions, particularly in the presence of clinical signs and  symptoms consistent with COVID-19, or in those who have been in contact with the virus.  Negative results must be combined with clinical observations, patient history, and epidemiological information. The expected result is Negative.  Fact Sheet for Patients: https://www.jennings-kim.com/  Fact Sheet for  Healthcare Providers: https://alexander-rogers.biz/  This test is not yet approved or cleared by the [place] FDA and  has been authorized for detection and/or diagnosis of SARS-CoV-2 by FDA under an Emergency Use Authorization (EUA).  This EUA will remain in effect (meaning this test can be used) for the duration of  the COV ID-19 declaration under Section 564(b)(1) of the Act, 21 U.S.C. section 360bbb-3(b)(1), unless the authorization is terminated or revoked sooner.    Pregnancy, urine POC     Status: None   Collection Time: 11/11/20  3:00 PM  Result Value Ref Range   Preg Test, Ur NEGATIVE NEGATIVE    Comment:        THE SENSITIVITY OF THIS METHODOLOGY IS >24 mIU/mL     Blood Alcohol level:  Lab Results  Component Value Date   ETH <10 11/11/2020    Metabolic Disorder Labs: Lab Results  Component Value Date   HGBA1C 4.6 (L) 11/11/2020   MPG 85.32 11/11/2020   No results found for: PROLACTIN Lab Results  Component Value Date   CHOL 140 11/11/2020   TRIG 37 11/11/2020   HDL 40 (L) 11/11/2020   CHOLHDL 3.5 11/11/2020   VLDL 7 11/11/2020   LDLCALC 93 11/11/2020    Physical Findings: AIMS: Facial and Oral Movements Muscles of Facial Expression: None, normal Lips and Perioral Area: None, normal Jaw: None, normal Tongue: None, normal,Extremity Movements Upper (arms, wrists, hands, fingers): None, normal Lower (legs, knees, ankles, toes): None, normal, Trunk Movements Neck, shoulders, hips: None, normal, Overall Severity Severity of abnormal movements (highest score from questions above): None, normal Incapacitation due to abnormal movements: None, normal Patient's awareness of abnormal movements (rate only patient's report): No Awareness, Dental Status Current problems with teeth and/or dentures?: No Does patient usually wear dentures?: No   Musculoskeletal: Strength & Muscle Tone: within normal limits Gait & Station: normal Patient leans:  N/A Psychiatric Specialty Exam: Physical Exam Vitals reviewed.  HENT:     Head: Normocephalic.  Pulmonary:     Effort: Pulmonary effort is normal.  Neurological:     General: No focal deficit present.     Mental Status: She is alert.    Review of Systems  Blood  pressure 108/79, pulse (!) 138, temperature 98.1 F (36.7 C), resp. rate 16, height 5\' 6"  (1.676 m), weight 59.9 kg, last menstrual period 11/10/2020, SpO2 99 %.Body mass index is 21.31 kg/m.  General Appearance:  casually dressed, adequate hygiene  Eye Contact:  Minimal  Speech:  Clear and Coherent and Normal Rate  Volume:  Decreased  Mood:  Anxious and Dysphoric  Affect:  Restricted and Tearful  Thought Process:  Goal Directed and Linear but ruminative about discharge planning  Orientation:  Full (Time, Place, and Person)  Thought Content:  Logical and denies AVH, paranoia, delusions, ideas of reference or first rank symptoms  Suicidal Thoughts:  No  Homicidal Thoughts:  No  Memory:  Recent;   Good  Judgement:  Fair  Insight:  Lacking  Psychomotor Activity: Restless and anxious appearing on exam - fidgety during interview  Concentration:  Concentration: Fair and Attention Span: Fair  Recall:  Good  Fund of Knowledge:  Good  Language:  Good  Akathisia:  Negative  Assets:  Communication Skills Desire for Improvement Housing Resilience Social Support  ADL's:  Intact  Cognition:  WNL  Sleep:  Number of Hours: 5.75   Treatment Plan Summary: Diagnoses / Active Problems: 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 Psychiatric Diagnoses and Treatment:   MDD single episode severe with questionable psychotic features  -- Continue Zoloft 25mg  qhs titrating up as tolerated  -- Observing for psychosis and can augment with antipsychotic if indicated  -- Continue PRN Trazodone for insomnia  -- Continue PRN Vistaril for anxiety  -- Continue Ensure and MVI for nutritional support  -- Encouraged patient to participate in unit milieu and in scheduled group therapies   -- Encouraged her to consider PHP/IOP after discharge and she agrees to psychotherapy after discharge  -- Short Term Goals: Ability to verbalize feelings will improve, Ability to disclose and discuss suicidal ideas, and Ability to demonstrate self-control will improve  -- Long Term Goals: Improvement in symptoms so as ready for discharge    3. Discharge Planning:   -- Social work and case management to assist with discharge planning and identification of hospital follow-up needs prior to discharge  -- Estimated LOS: 2-3 days  -- Discharge Concerns: Need to establish a safety plan; Medication compliance and effectiveness  -- Discharge Goals: Return home with outpatient referrals for mental health follow-up including medication management/psychotherapy  01/10/2021, MD, FAPA 11/13/2020, 6:29 AM

## 2020-11-13 NOTE — Group Note (Signed)
Group Topic: Goal Setting  Group Date: 11/13/2020 Start Time: 0900 End Time: 1000 Facilitators: Dione Housekeeper, RN  Department: [facility] INPATIENT ADULT 300B  Number of Participants: 13  Group Focus: affirmation, clarity of thought, and goals/reality orientation Treatment Modality:  Psychoeducation Interventions utilized were assignment, group exercise, and support Purpose: To be able to understand and verbalize the reason for their admission to the hospital. To understand that the medication helps with their chemical imbalance but they also need to work on their choices in life. To be challenged to develop a list of 30 positives about themselves. Also introduce the concept that "feelings" are not reality.  Name: Diana Phillips Date of Birth: [birth year]  MR: [id number]    Level of Participation: did not attend group Patients Problems:  Patient Active Problem List   Diagnosis Date Noted   Suicidal ideation 11/12/2020   MDD (major depressive disorder), single episode, severe (HCC) 11/11/2020   Fracture of radial neck, right, closed 02/02/2014

## 2020-11-13 NOTE — Progress Notes (Signed)
   11/12/20 1000  Psych Admission Type (Psych Patients Only)  Admission Status Voluntary  Psychosocial Assessment  Patient Complaints Anxiety;Depression;Isolation;Sadness;Worrying  Eye Contact Brief  Facial Expression Sullen;Sad;Worried  Affect Anxious;Depressed;Sad;Sullen  Speech Logical/coherent;Soft  Interaction Forwards little;Guarded;Minimal  Motor Activity Slow  Appearance/Hygiene Improved  Behavior Characteristics Cooperative;Appropriate to situation;Anxious;Guarded  Mood Depressed;Anxious;Sad;Sullen;Pleasant  Thought Administrator, sports thinking  Content Blaming self  Delusions None reported or observed  Perception WDL  Hallucination None reported or observed  Judgment Poor  Confusion None  Danger to Self  Current suicidal ideation? Passive  Self-Injurious Behavior Some self-injurious ideation observed or expressed.  No lethal plan expressed   Agreement Not to Harm Self Yes  Description of Agreement verbal  Danger to Others  Danger to Others None reported or observed

## 2020-11-13 NOTE — Progress Notes (Signed)
[facility] Group Notes:  (Nursing/MHT/Case Management/Adjunct)  Date:  11/13/2020  Time:  2000  Type of Therapy:   wrap up group  Participation Level:  Active  Participation Quality:  Appropriate, Attentive, Sharing, and Supportive  Affect:  Appropriate  Cognitive:  Appropriate  Insight:  Improving  Engagement in Group:  Engaged  Modes of Intervention:  Clarification, Education, and Support  Summary of Progress/Problems: Positive thinking and positive change were discussed.Marcille Buffy 11/13/2020, 9:15 PM

## 2020-11-13 NOTE — [facility] Group Notes (Signed)
Adult Psychoeducational Group Note  Date:  11/13/2020 Time:  10:30 AM  Group Topic/Focus:  Goals Group:   The focus of this group is to help patients establish daily goals to achieve during treatment and discuss how the patient can incorporate goal setting into their daily lives to aide in recovery.  Participation Level:  Active Pt attended morning goals group.   Deforest Hoyles Anddy Wingert 11/13/2020, 10:30 AM

## 2020-11-14 MED ORDER — SERTRALINE HCL 50 MG PO TABS
50.0000 mg | ORAL_TABLET | Freq: Every day | ORAL | Status: DC
  Administered 2020-11-14: 50 mg via ORAL
  Filled 2020-11-14 (×2): qty 1

## 2020-11-14 NOTE — Progress Notes (Signed)
Patient has been up and active on the unit, attended group this evening and has voiced no complaints. Patient currently denies having pain, -si/hi/a/v hall. Support and encouragement offered, safety maintained on unit, will continue to monitor.  

## 2020-11-14 NOTE — Group Note (Signed)
  [facility] LCSW Group Therapy Note  Date/Time:  11/14/2020 10:00AM-11:00AM  Type of Therapy and Topic:  Group Therapy:  Self-Care Wheel  Participation Level:  Minimal   Description of Group This process group involved patients discussing the importance of self-care in different areas of life (professional, personal, emotional, psychological, spiritual, and physical) in order to achieve healthy life balance.  The group talked about what self-care in each of those areas would constitute and then specifically listed how they want to provide themselves with improved self-care.  Therapeutic Goals Patient will learn how to break self-care down into various areas of life Patient will participate in generating ideas about healthy self-care options in each category Patients will be supportive of one another and receive support from others Patient will identify one healthy self-care activity to add to his/her life   Summary of Patient Progress:  The patient expressed that one thing he/she can share about him/herself is that she likes art.  Because others in group expressed a similar interest, this helped him/her to see how people are all connected in a variety of ways.  During the discussion about self-care, he/she did not share but was attentive in listening, nodding her head numerous times to other patients' comments.  He/She was able to identify ways in which self-care in different areas would be helpful.  Patient's insight was unable to be ascertained due to her silence.  Therapeutic Modalities Processing Psychoeducation   Ambrose Mantle, LCSW 11/14/2020 2:47 PM

## 2020-11-14 NOTE — Group Note (Signed)
Group Topic: PROGRESSIVE RELAXATION. A group where deep breathing is taught and tensing and relaxation muscle groups is used. Imagery is used as well.  Pts are asked to imagine 3 pillars that hold them up when they are not able to hold themselves up. Group Date: 11/14/2020 Start Time: 0900 End Time: 1000 Facilitators: Dione Housekeeper, RN  Department: [facility] INPATIENT ADULT [facility]B  Number of Participants: 16  Group Focus: anxiety and clarity of thought Treatment Modality:  Cognitive Behavioral Therapy, Skills Training, and Spiritual Interventions utilized were exploration and support Purpose: enhance coping skills and reinforce self-care  Name: Diana Phillips Date of Birth: 14-Mar-1995  MR: [id number]    Level of Participation: minimal Quality of Participation: attentive Interactions with others: gave feedback Mood/Affect: appropriate Triggers (if applicable):  Cognition: goal directed Progress: Moderate Response: Pt rates her energy level at a 10/10. States what holds her up is family, friends and God Plan: patient will be encouraged to To continue to attend the program  Patients Problems:  Patient Active Problem List   Diagnosis Date Noted   Suicidal ideation 11/12/2020   MDD (major depressive disorder), single episode, severe (HCC) 11/11/2020   Fracture of radial neck, right, closed 02/02/2014

## 2020-11-14 NOTE — Group Note (Signed)
Late Note for 11/13/2020  Clinical Social Work Note  No group could be held this day due to low staffing.  Other licensed group was held.  Alyse Kathan Grossman-Orr, LCSW 11/13/2020    

## 2020-11-14 NOTE — Progress Notes (Signed)
   11/14/20 1700  Psych Admission Type (Psych Patients Only)  Admission Status Voluntary  Psychosocial Assessment  Patient Complaints None  Eye Contact Brief  Facial Expression Sullen;Anxious  Affect Depressed  Speech Logical/coherent  Interaction Guarded  Motor Activity Other (Comment)  Appearance/Hygiene Unremarkable  Behavior Characteristics Cooperative  Mood Pleasant  Thought Process  Coherency Circumstantial  Content WDL  Delusions None reported or observed  Perception WDL  Hallucination None reported or observed  Judgment Poor  Confusion None  Danger to Self  Current suicidal ideation? Denies  Danger to Others  Danger to Others None reported or observed

## 2020-11-14 NOTE — [facility] Group Notes (Signed)
Pt attended relaxation group this afternoon.  

## 2020-11-14 NOTE — [facility] Group Notes (Signed)
Pt attended morning group with the nurse. 

## 2020-11-14 NOTE — Progress Notes (Signed)
[facility] Group Notes:  (Nursing/MHT/Case Management/Adjunct)  Date:  11/14/2020  Time:  2015  Type of Therapy:   wrap up group  Participation Level:  Active  Participation Quality:  Appropriate, Attentive, Sharing, and Supportive  Affect:  Appropriate  Cognitive:  Alert  Insight:  Improving  Engagement in Group:  Engaged  Modes of Intervention:  Clarification, Education, and Support  Summary of Progress/Problems: Positive thinking and self-care were discussed.   Diana Phillips 11/14/2020, 9:21 PM

## 2020-11-14 NOTE — Progress Notes (Addendum)
[facility] MD Progress Note  11/14/2020 11:26 AM Diana Phillips  MRN:  [id number]  Chief Complaint: SI  Subjective: Diana Phillips is a 25 y.o. female with no known past psychiatric history, who was initially admitted for inpatient psychiatric hospitalization on 11/11/2020 for management of worsening depression and SI with thoughts of drowning herself. The patient is currently on Hospital Day 3.   Chart Review from last 24 hours:  The patient's chart was reviewed and nursing notes were reviewed. The patient's case was discussed in multidisciplinary team meeting. Per nursing patient had no acute behavioral issues or safety concerns and attended some groups. Per MAR she was compliant with scheduled medications and received Trazodone PRN X1 for sleep.  Information Obtained Today During Patient Interview: The patient was seen and evaluated on the unit. On assessment today the patient reports that she slept pretty well overnight but has not had breakfast this morning. She did eat some meals yesterday and reports that she is attending to ADLs.  Patient states she received news that she would be able to tryout for a team next month which made her feel much better.  Patient states that her mood has improved.  Patient states she is sleeping well and denies having side effects from the medication at this time.  Patient denies having any physical complaints at this time.  Counseled patient on the importance of attending group as well as being active in her own care.  Encourage patient to learn some coping skills so that she can tolerate stresses rather than attempting to drown herself in a [place] and para suicidal behavior like punching windows and cutting herself.  Patient verbalizes agreement and feels that her hospitalization has been beneficial but she feels ready to go home.  Patient denies SI/HI/AVH.  Patient denies paranoia, first rank symptoms, delusions, thought broadcasting, ideas of reference.  Principal  Problem: MDD (major depressive disorder), single episode, severe (HCC) Diagnosis: Principal Problem:   MDD (major depressive disorder), single episode, severe (HCC) Active Problems:   Suicidal ideation  Total Time Spent in Direct Patient Care:  I personally spent 30 minutes on the unit in direct patient care. The direct patient care time included face-to-face time with the patient, reviewing the patient's chart, communicating with other professionals, and coordinating care. Greater than 50% of this time was spent in counseling or coordinating care with the patient regarding goals of hospitalization, psycho-education, and discharge planning needs.  Past Psychiatric History: see H&P  Past Medical History: Denied  Family History: see H&P  Family Psychiatric  History: see H&P  Social History:  Social History   Substance and Sexual Activity  Alcohol Use No  . Alcohol/week: 0.0 standard drinks     Social History   Substance and Sexual Activity  Drug Use No    Social History   Socioeconomic History  . Marital status: Single    Spouse name: Not on file  . Number of children: Not on file  . Years of education: Not on file  . Highest education level: Bachelor's degree (e.g., BA, AB, BS)  Occupational History  . Not on file  Tobacco Use  . Smoking status: Never  . Smokeless tobacco: Never  Vaping Use  . Vaping Use: Never used  Substance and Sexual Activity  . Alcohol use: No    Alcohol/week: 0.0 standard drinks  . Drug use: No  . Sexual activity: Yes    Birth control/protection: None  Other Topics Concern  . Not on file  Social  History Narrative  . Not on file   Social Determinants of Health   Financial Resource Strain: Not on file  Food Insecurity: Not on file  Transportation Needs: Not on file  Physical Activity: Not on file  Stress: Not on file  Social Connections: Not on file   Additional Social History:  Recent ending of lesbian relationship with partner of  2 1/2 years, living with mother, unemployed  Sleep: Fair  Appetite:  Fair  Current Medications: Current Facility-Administered Medications  Medication Dose Route Frequency Provider Last Rate Last Admin  . acetaminophen (TYLENOL) tablet 650 mg  650 mg Oral Q6H PRN Lenard Lance, FNP      . alum & mag hydroxide-simeth (MAALOX/MYLANTA) 200-200-20 MG/5ML suspension 30 mL  30 mL Oral Q4H PRN Lenard Lance, FNP      . feeding supplement (ENSURE ENLIVE / ENSURE PLUS) liquid 237 mL  237 mL Oral BID BM Jaclyn Shaggy, PA-C   237 mL at 11/13/20 1505  . hydrOXYzine (ATARAX/VISTARIL) tablet 25 mg  25 mg Oral TID PRN Lenard Lance, FNP   25 mg at 11/11/20 2224  . magnesium hydroxide (MILK OF MAGNESIA) suspension 30 mL  30 mL Oral Daily PRN Lenard Lance, FNP      . multivitamin with minerals tablet 1 tablet  1 tablet Oral Daily Comer Locket, MD   1 tablet at 11/14/20 0806  . sertraline (ZOLOFT) tablet 50 mg  50 mg Oral QHS Park Pope, MD      . traZODone (DESYREL) tablet 50 mg  50 mg Oral QHS PRN,MR X 1 Park Pope, MD   50 mg at 11/13/20 2207    Lab Results:  No results found for this or any previous visit (from the past 48 hour(s)).   Blood Alcohol level:  Lab Results  Component Value Date   ETH <10 11/11/2020    Metabolic Disorder Labs: Lab Results  Component Value Date   HGBA1C 4.6 (L) 11/11/2020   MPG 85.32 11/11/2020   Lab Results  Component Value Date   PROLACTIN 12.1 11/11/2020   Lab Results  Component Value Date   CHOL 140 11/11/2020   TRIG 37 11/11/2020   HDL 40 (L) 11/11/2020   CHOLHDL 3.5 11/11/2020   VLDL 7 11/11/2020   LDLCALC 93 11/11/2020    Physical Findings:  Musculoskeletal: Strength & Muscle Tone: within normal limits Gait & Station: normal Patient leans: N/A Psychiatric Specialty Exam: Physical Exam Vitals reviewed.  HENT:     Head: Normocephalic.  Pulmonary:     Effort: Pulmonary effort is normal.  Neurological:     General: No focal deficit  present.     Mental Status: She is alert.    Review of Systems  Respiratory:  Negative for shortness of breath.   Cardiovascular:  Negative for chest pain.  Gastrointestinal:  Negative for diarrhea, nausea and vomiting.   Blood pressure 111/83, pulse (!) 115, temperature 97.7 F (36.5 C), temperature source Oral, resp. rate 16, height 5\' 6"  (1.676 m), weight 59.9 kg, last menstrual period 11/10/2020, SpO2 100 %.Body mass index is 21.31 kg/m.  General Appearance:  casually dressed, adequate hygiene  Eye Contact:  Improved   Speech:  Clear and Coherent and Normal Rate  Volume:  normal  Mood: described as improved - appears more euthymic  Affect:  calm, polite, brighter  Thought Process:  Goal Directed and Linear   Orientation:  Full (Time, Place, and Person)  Thought Content:  Logical and denies AVH, paranoia, delusions, ideas of reference or first rank symptoms  Suicidal Thoughts:  No  Homicidal Thoughts:  No  Memory:  Recent;   Good  Judgement:  Fair  Insight:  Improving  Psychomotor Activity: Normal  Concentration:  Good  Recall:  Good  Fund of Knowledge:  Good  Language:  Good  Akathisia:  Negative  Assets:  Communication Skills Desire for Improvement Housing Resilience Social Support  ADL's:  Intact  Cognition:  WNL  Sleep:  Number of Hours: 5.25   Treatment Plan Summary: Patient appears to be improving mood and has been pleasant and cooperative throughout assessment today.  Patient agreeable to increasing Zoloft and for outpatient psychiatry management.  Patient agreeable to outpatient therapy as well.  Plan for discharge tomorrow should mother be agreeable to this.  Diagnoses / Active Problems: MDD single episode severe with questionable psychotic features  PLAN: Safety and Monitoring:  -- Voluntary admission to inpatient psychiatric unit for safety, stabilization and treatment (discussed the process for 72 hour request for discharge if she desires to pursue  this)  -- Daily contact with patient to assess and evaluate symptoms and progress in treatment  -- Patient's case to be discussed in multi-disciplinary team meeting  -- Observation Level : q15 minute checks  -- Vital signs:  q12 hours  -- Precautions: suicide, elopement, and assault  2. Psychiatric Diagnoses and Treatment:   MDD single episode severe with no current psychotic features  -- Increase Zoloft 50 mg qhs titrating up as tolerated (discussed FDA black box warning and the need to monitor for SI with dose increase)  -- No current signs of psychosis  -- Continue PRN Trazodone for insomnia  -- Continue PRN Vistaril for anxiety  -- Continue Ensure and MVI for nutritional support  -- Would benefit from psychotherapy after discharge  -- Encouraged patient to participate in unit milieu and in scheduled group therapies   -- Encouraged her to consider PHP/IOP after discharge and she agrees to psychotherapy after discharge  -- Short Term Goals: Ability to verbalize feelings will improve, Ability to disclose and discuss suicidal ideas, and Ability to demonstrate self-control will improve  -- Long Term Goals: Improvement in symptoms so as ready for discharge    3. Discharge Planning:   -- Social work and case management to assist with discharge planning and identification of hospital follow-up needs prior to discharge  -- Estimated LOS: 2-3 days  -- Discharge Concerns: Need to establish a safety plan; Medication compliance and effectiveness  -- Discharge Goals: Return home with outpatient referrals for mental health follow-up including medication management/psychotherapy  Park Pope, MD 11/14/2020, 11:26 AM

## 2020-11-15 MED ORDER — SERTRALINE HCL 50 MG PO TABS: 50.0000 mg | ORAL_TABLET | Freq: Every day | ORAL | 0 refills | Status: AC

## 2020-11-15 MED ORDER — HYDROXYZINE HCL 25 MG PO TABS: 25.0000 mg | ORAL_TABLET | Freq: Three times a day (TID) | ORAL | 0 refills | Status: AC | PRN

## 2020-11-15 MED ORDER — TRAZODONE HCL 50 MG PO TABS: 50.0000 mg | ORAL_TABLET | Freq: Every evening | ORAL | 0 refills | Status: AC | PRN

## 2020-11-15 NOTE — [facility] Suicide Risk Assessment (Signed)
[facility] Discharge Suicide Risk Assessment   Principal Problem: MDD (major depressive disorder), single episode, severe (HCC) Discharge Diagnoses: Principal Problem:   MDD (major depressive disorder), single episode, severe (HCC)  Total Time Spent in Direct Patient Care:  I personally spent 35 minutes on the unit in direct patient care. The direct patient care time included face-to-face time with the patient, reviewing the patient's chart, communicating with other professionals, and coordinating care. Greater than 50% of this time was spent in counseling or coordinating care with the patient regarding goals of hospitalization, psycho-education, and discharge planning needs.'  Subjective: Patient seen on rounds with Automotive engineer. She states she is feeling improved in terms of her mood and shows signs of forward thinking today. She requests discharge and plans to stay with her mother after she leaves. She denies SI, HI, AVH, paranoia or delusions. She reports stable sleep and appetite and voices no physical complaints. She was encouraged to comply with outpatient mental health appointments, medications, and therapy after discharge. She was reminded to watch for return of SI while she continues on Zoloft. Time was given for questions.   Musculoskeletal: Strength & Muscle Tone: within normal limits Gait & Station: normal Patient leans: N/A Psychiatric Specialty Exam: Physical Exam Vitals reviewed.  HENT:     Head: Normocephalic.  Pulmonary:     Effort: Pulmonary effort is normal.  Neurological:     General: No focal deficit present.     Mental Status: She is alert.    Review of Systems  Respiratory:  Negative for shortness of breath.   Cardiovascular:  Negative for chest pain.  Gastrointestinal:  Negative for diarrhea, nausea and vomiting.   Blood pressure 117/79, pulse 92, temperature 97.7 F (36.5 C), temperature source Oral, resp. rate 16, height 5\' 6"  (1.676 m), weight 59.9 kg, last  menstrual period 11/10/2020, SpO2 100 %.Body mass index is 21.31 kg/m.  General Appearance: Well Groomed and casually dressed  Eye Contact:  Good  Speech:  Clear and Coherent and Normal Rate  Volume:  Normal  Mood:  more euthymic appearing  Affect:  brighter, can smile today  Thought Process:  Goal Directed and Linear  Orientation:  Full (Time, Place, and Person)  Thought Content:  Logical and denies AVH, paranoia or delusions  Suicidal Thoughts:  No  Homicidal Thoughts:  No  Memory:  Recent;   Good  Judgement:  Fair  Insight:  Fair  Psychomotor Activity:  Normal  Concentration:  Concentration: Good and Attention Span: Good  Recall:  Good  Fund of Knowledge:  Good  Language:  Good  Akathisia:  Negative  Assets:  Communication Skills Desire for Improvement Housing Physical Health Resilience Social Support Talents/Skills  ADL's:  Intact  Cognition:  WNL  Sleep:  Number of Hours: 6.5   Mental Status Per Nursing Assessment::   On Admission:  Suicidal ideation indicated by patient - resolved  Demographic Factors:  Adolescent or young adult, Gay, lesbian, or bisexual orientation, and Unemployed  Loss Factors: Decrease in vocational status, Loss of significant relationship, and Financial problems/change in socioeconomic status  Historical Factors: Prior suicide attempts  Risk Reduction Factors:   Sense of responsibility to family, Living with another person, especially a relative, Positive social support, and Positive coping skills or problem solving skills  Continued Clinical Symptoms:  Depression  Cognitive Features That Contribute To Risk:  Thought constriction (tunnel vision)    Suicide Risk:  Mild:  There are no identifiable plans, no associated intent, mild dysphoria  and related symptoms,  few other risk factors, and identifiable protective factors, including available and accessible social support.   Follow-up Information     [facility] Go on  11/18/2020.   Why: You have a hospital follow up appointment for therapy and medication management services on 11/18/20 at 11:00 am.  This appointment will be held in person. Contact information: [address] [place] [address] [phone]                 Plan Of Care/Follow-up recommendations:  Activity:  as tolerated Diet:  heart healthy Other:  Patient advised to keep scheduled outpatient mental health follow up and therapy appointments and to comply with medication. She was encouraged to continue abstaining from alcohol and illicit substances after discharge. She was encouraged to monitor closely for return of suicidal thinking while on an antidepressant.   Comer Locket, MD, FAPA 11/15/2020, 7:14 AM

## 2020-11-15 NOTE — Progress Notes (Signed)
D: Patient will discharge home today per MD order. Patient denies any thoughts of self harm. Her mood appears to be brighter. She states that her mother will pick her up around 1130. She is observed in the day room coloring.  A: Continue to monitor medication management and MD orders.  Safety checks completed every 15 minutes per protocol.  Offer support and encouragement as needed.  R: Patient is receptive to staff; her behavior is appropriate.     11/15/20 0800  Psych Admission Type (Psych Patients Only)  Admission Status Voluntary  Psychosocial Assessment  Patient Complaints None  Eye Contact Fair  Facial Expression Anxious  Affect Appropriate to circumstance  Speech Logical/coherent  Interaction Assertive  Motor Activity Other (Comment) (wnl)  Appearance/Hygiene Unremarkable  Behavior Characteristics Cooperative  Mood Pleasant  Thought Process  Coherency WDL  Content WDL  Delusions None reported or observed  Perception WDL  Hallucination None reported or observed  Judgment Impaired  Confusion None  Danger to Self  Current suicidal ideation? Denies  Danger to Others  Danger to Others None reported or observed

## 2020-11-15 NOTE — Group Note (Signed)
Recreation Therapy Group Note   Group Topic:Stress Management  Group Date: 11/15/2020 Start Time: 0935 End Time: 0950 Facilitators: Caroll Rancher, LRT/CTRS Location: [address] Dayroom  Goal Area(s) Addresses:  Patient will actively participate in stress management techniques presented during session.  Patient will successfully identify benefit of practicing stress management post d/c.   Group Description: Guided Imagery. LRT provided education, instruction, and demonstration on practice of visualization via guided imagery. Patient was asked to participate in the technique introduced during session. LRT debriefed including topics of mindfulness, stress management and specific scenarios each patient could use these techniques. Patients were given suggestions of ways to access scripts post d/c and encouraged to explore Youtube and other apps available on smartphones, tablets, and computers.   Affect/Mood: Appropriate   Participation Level: Minimal   Participation Quality: Independent   Behavior: Appropriate   Speech/Thought Process: Distracted   Insight: Good   Judgement: Good   Modes of Intervention: Soft Sounds, Script   Patient Response to Interventions:  Receptive   Education Outcome:  Acknowledges education and In group clarification offered    Clinical Observations/Individualized Feedback: Pt was distracted passing notes with peers and not fully engaging in activity.    Plan: Continue to engage patient in RT group sessions 2-3x/week.   Caroll Rancher, LRT/CTRS 11/15/2020 12:42 PM

## 2020-11-15 NOTE — Plan of Care (Signed)
Called Patient's mother Docia Barrier to discuss safety planning and discussed black box warning for Zoloft in patients younger than 25.  Mother verbalized agreement and stated she would also keep an eye out for any worsening suicidal ideation or suicidal behavior.  Mother agreed to pick up patient at noon today.  Park Pope, MD PGY1 Psychiatry Resident

## 2020-11-15 NOTE — Progress Notes (Signed)
  [facility] Adult Case Management Discharge Plan :  Will you be returning to the same living situation after discharge:  Yes,  Home with mother  At discharge, do you have transportation home?: Yes,  Mother  Do you have the ability to pay for your medications: Yes,  Medicaid   Release of information consent forms completed and in the chart;  Patient's signature needed at discharge.  Patient to Follow up at:  Follow-up Information     [facility] Go on 11/18/2020.   Why: You have a hospital follow up appointment for therapy and medication management services on 11/18/20 at 11:00 am.  This appointment will be held in person. Contact information: [address] [place] [address] [phone]                 Next level of care provider has access to [facility] Link:no  Safety Planning and Suicide Prevention discussed: Yes,  with patient and mother      Has patient been referred to the Quitline?: N/A patient is not a smoker  Patient has been referred for addiction treatment: N/A  [name], LCSWA 11/15/2020, 10:40 AM

## 2020-11-15 NOTE — Discharge Summary (Addendum)
Physician Discharge Summary Note  Patient:  Diana Phillips is an 25 y.o., female MRN:  [id number] DOB:  09-Sep-1995 Patient phone:  [phone] (home)  Patient address:   [address] [place] [address],  Total Time spent with patient:  I personally spent 60 minutes on the unit in direct patient care. The direct patient care time included face-to-face time with the patient, reviewing the patient's chart, communicating with other professionals, and coordinating care. Greater than 50% of this time was spent in counseling or coordinating care with the patient regarding goals of hospitalization, psycho-education, and discharge planning needs.   Date of Admission:  11/11/2020 Date of Discharge: 11/15/2020  Reason for Admission:  Diana Phillips is a 25 y.o. female with no known past psychiatric history, who was initially admitted for inpatient psychiatric hospitalization on 11/11/2020 for management of worsening depression and SI with thoughts of drowning herself.  PER H&P Patient states that she has had a great deal of stressors recently including breaking up with her girlfriend, disappointment and career not going as she had intended.  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  The stress has been overwhelming for her in that she has not been drafted and she has been feeling more more depressed since she graduated last May.  Patient states that she had attempted to drown herself 2 weeks ago after feeling extremely depressed.  Patient states that she is getting poor sleep approximately 2 to 3 hours a day, depressed mood, low interest, anhedonia, poor concentration, poor energy, no appetite.  Patient states she has lost a lot of friendships due to stress of trying to achieve her basketball career.  The symptoms have worsened for the past several months.  Patient has passive SI but is able to contract for  safety while on the unit.  Patient denies HI/AVH.  Patient states that she occasionally sees shadows in the corner of her eye stating that she saw this a few days ago.  Patient does report having para suicidal behaviors including cutting herself as well as punching windows.  Patient states that she last punched a window 2 weeks ago.  Patient states that she has not cut herself in months but these were in an effort to deal with her stresses rather than attempting to kill her self.  Patient states that while at [facility] she received trazodone 50 mg for sleep.  Patient states that she was not able to fall asleep for a while but was able to finally fall asleep and stay asleep. Patient requesting antidepressant Zoloft for management of depression.  Encourage patient to attend group and be active during her stay on the unit.  Patient verbalizes agreement.   Patient denies manic symptoms including grandiosity, elevated mood, pressured speech.  Patient denies paranoia, delusions, frustrating symptoms, thought broadcasting.  Patient denies having ever experienced trauma including verbal, physical, sexual, witnessed or experienced.   Patient states that her last menstrual period was 2 days ago.  Patient states that she lives with her mom and her mom's husband and 2 brothers.  Patient is not currently employed.  Patient has no known medical problems.  Patient denies using substances including cocaine, heroin, marijuana.  Patient denies alcohol use.  Patient denies tobacco use.  Patient has seen a counselor in the past for 2 years in college but has never seen a psychiatrist.   Per [facility] assessment,  mother had stated that patient has experienced multiple "devastation's" and is having difficulty coping regarding these incidences.  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  Mother states that patient has been having trouble finding work which is also compounding her depression and  hopelessness at this time.  Principal Problem: MDD (major depressive disorder), single episode, severe (HCC) Discharge Diagnoses: Principal Problem:   MDD (major depressive disorder), single episode, severe (HCC)   Past Psychiatric History: See H&P  Past Medical History: History reviewed. No pertinent past medical history. History reviewed. No pertinent surgical history. Family History: History reviewed. No pertinent family history. Family Psychiatric  History: See H&P Social History:  Social History   Substance and Sexual Activity  Alcohol Use No   Alcohol/week: 0.0 standard drinks     Social History   Substance and Sexual Activity  Drug Use No    Social History   Socioeconomic History   Marital status: Single    Spouse name: Not on file   Number of children: Not on file   Years of education: Not on file   Highest education level: Bachelor's degree (e.g., BA, AB, BS)  Occupational History   Not on file  Tobacco Use   Smoking status: Never   Smokeless tobacco: Never  Vaping Use   Vaping Use: Never used  Substance and Sexual Activity   Alcohol use: No    Alcohol/week: 0.0 standard drinks   Drug use: No   Sexual activity: Yes    Birth control/protection: None  Other Topics Concern   Not on file  Social History Narrative   Not on file   Social Determinants of Health   Financial Resource Strain: Not on file  Food Insecurity: Not on file  Transportation Needs: Not on file  Physical Activity: Not on file  Stress: Not on file  Social Connections: Not on file    Hospital Course:   After the above admission evaluation, patient's presenting symptoms were noted. Patient was recommended for antidepressant treatment. The medication regimen targeting those presenting symptoms were discussed with patient & initiated with patient's consent. Patient was started on Zoloft 25 mg that was titrated up to 50 mg.  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    Patient had no complaints throughout the hospitalization and felt that the medication regiment was appropriate. Patient endorsed improved mood due to being hopeful about her basketball career as well as attending group.  Patient also states that she was sleeping more appropriately while on the unit.  Pertinent labs drawn during hospitalizations include: Negative pregnancy test, negative UDS, negative COVID PCR, normal prolactin, normal TSH, WNL lipid panel, negative alcohol, normal magnesium, A1c 4.6, WNL CMP, CBC WNL beyond RDW 11.2 and absolute lymphocyte of 0.6.   During the course of patient's hospitalization, the 15-minute checks were adequate to ensure patient's safety. Patient did not exhibit erratic or aggressive behavior and was compliant with scheduled medication. Patient was recommended for outpatient psychiatry and therapy.  At the time of discharge patient is not reporting any acute suicidal/homicidal ideations/AVH, delusional thoughts or paranoia. Patient did not appear to be responding to any internal stimuli. Patient expressed improved mood and emotional regulation. Patient feels more confident about self-care & in managing their mental health problems. Patient currently denies any new issues or concerns. Education and supportive counseling provided throughout patient's hospital stay &  upon discharge.   Today upon discharge evaluation with the attending psychiatrist Dr. Mason Jim, patient's mood is "good". Patient denies any specific concerns. Patient slept well, appetite good, regular bowel movements. Patient denies any physical complaints. Patient feels that the medications have been helpful & is in agreement to continue current treatment regimen as recommended. Patient was able to engage in safety planning including plan to return to [facility] or contact emergency services if patient feels unable to maintain their own  safety or the safety of others. Patient had no further questions, comments, or concerns. Patient left [facility] with all personal belongings in no apparent distress. Transportation per mother to home was arranged for patient.  Physical Findings:  Musculoskeletal: Strength & Muscle Tone: within normal limits Gait & Station: normal Patient leans: N/A   Psychiatric Specialty Exam:  Presentation  General Appearance: Appropriate for Environment; Neat  Eye Contact:Good  Speech:Clear and Coherent; Normal Rate  Speech Volume:Normal  Handedness:Right   Mood and Affect  Mood:Euthymic  Affect:Congruent; Appropriate   Thought Process  Thought Processes:Coherent; Goal Directed; Linear  Descriptions of Associations:Intact  Orientation:Full (Time, Place and Person)  Thought Content:Logical  History of Schizophrenia/Schizoaffective disorder:No  Duration of Psychotic Symptoms:Less than six months  Hallucinations:Hallucinations: None  Ideas of Reference:None  Suicidal Thoughts:Suicidal Thoughts: No  Homicidal Thoughts:Homicidal Thoughts: No   Sensorium  Memory:Immediate Good; Recent Good; Remote Good  Judgment:Good  Insight:Fair   Executive Functions  Concentration:Good  Attention Span:Good  Recall:Good  Fund of Knowledge:Good  Language:Good   Psychomotor Activity  Psychomotor Activity:Psychomotor Activity: Normal   Assets  Assets:Communication Skills; Desire for Improvement; Financial Resources/Insurance; Housing; Leisure Time; Physical Health; Resilience; Social Support; Talents/Skills; Transportation   Sleep  Sleep:Sleep: Good Number of Hours of Sleep: 6.5    Physical Exam: Physical Exam Vitals and nursing note reviewed.  Constitutional:      Appearance: Normal appearance. She is normal weight.  HENT:     Head: Normocephalic and atraumatic.  Pulmonary:     Effort: Pulmonary effort is normal.  Neurological:     General: No focal deficit  present.     Mental Status: She is oriented to person, place, and time.   Review of Systems  Respiratory:  Negative for shortness of breath.   Cardiovascular:  Negative for chest pain.  Gastrointestinal:  Negative for abdominal pain, constipation, diarrhea, heartburn, nausea and vomiting.  Neurological:  Negative for headaches.  Blood pressure 115/89, pulse 98, temperature 97.7 F (36.5 C), temperature source Oral, resp. rate 16, height 5\' 6"  (1.676 m), weight 59.9 kg, last menstrual period 11/10/2020, SpO2 99 %. Body mass index is 21.31 kg/m.   Social History   Tobacco Use  Smoking Status Never  Smokeless Tobacco Never   Tobacco Cessation:  N/A, patient does not currently use tobacco products   Blood Alcohol level:  Lab Results  Component Value Date   ETH <10 11/11/2020    Metabolic Disorder Labs:  Lab Results  Component Value Date   HGBA1C 4.6 (L) 11/11/2020   MPG 85.32 11/11/2020   Lab Results  Component Value Date   PROLACTIN 12.1 11/11/2020   Lab Results  Component Value Date   CHOL 140 11/11/2020   TRIG 37 11/11/2020   HDL 40 (L) 11/11/2020   CHOLHDL 3.5 11/11/2020   VLDL 7 11/11/2020   LDLCALC 93 11/11/2020    See Psychiatric Specialty Exam and Suicide Risk Assessment completed by Attending Physician prior to discharge.  Discharge destination:  Home  Is patient on multiple  antipsychotic therapies at discharge:  No   Has Patient had three or more failed trials of antipsychotic monotherapy by history:  No  Recommended Plan for Multiple Antipsychotic Therapies: NA   Allergies as of 11/15/2020   No Known Allergies      Medication List     TAKE these medications      Indication  hydrOXYzine 25 MG tablet Commonly known as: ATARAX/VISTARIL Take 1 tablet (25 mg total) by mouth every 8 (eight) hours as needed for anxiety.  Indication: Feeling Anxious   loratadine 10 MG tablet Commonly known as: CLARITIN Take 10 mg by mouth daily.   Indication: Hayfever   sertraline 50 MG tablet Commonly known as: ZOLOFT Take 1 tablet (50 mg total) by mouth at bedtime.  Indication: Major Depressive Disorder   traZODone 50 MG tablet Commonly known as: DESYREL Take 1 tablet (50 mg total) by mouth at bedtime as needed and may repeat dose one time if needed for sleep.  Indication: Trouble Sleeping        Follow-up Information     [facility] Go on 11/18/2020.   Why: You have a hospital follow up appointment for therapy and medication management services on 11/18/20 at 11:00 am.  This appointment will be held in person. Contact information: [address] [place] [address] [phone]                 Follow-up recommendations:   Activity:  as tolerated Diet:  heart healthy   Comments:  Prescriptions were given at discharge.  Patient is agreeable with the discharge plan.  She was given an opportunity to ask questions.  She appears to feel comfortable with discharge and denies any current suicidal or homicidal thoughts.    Patient is instructed prior to discharge to: Take all medications as prescribed by her mental healthcare provider. Report any adverse effects and or reactions from the medicines to her outpatient provider promptly. In the event of worsening symptoms, patient is instructed to call the crisis hotline, 911 and or go to the nearest ED for appropriate evaluation and treatment of symptoms. Patient is to follow-up with her primary care provider for other medical issues, concerns and or health care needs.   Signed: Park Pope, MD 11/15/2020, 12:23 PM

## 2020-11-15 NOTE — Progress Notes (Signed)
Patient ID: Diana Phillips, female   DOB: 1995-10-09, 25 y.o.   MRN: [id number] Patient discharged home per MD order. Patient received all her belongings from the locker and her room. She denies any thoughts of self harm. Reviewed AVS with patient and she understood all follow up appointments. Patient received paper prescriptions. She left ambulatory with her mother.

## 2020-11-15 NOTE — Progress Notes (Signed)
   11/15/20 0131  Psych Admission Type (Psych Patients Only)  Admission Status Voluntary  Psychosocial Assessment  Patient Complaints None  Eye Contact Fair  Facial Expression Anxious  Affect Appropriate to circumstance  Speech Logical/coherent  Interaction Assertive  Motor Activity Other (Comment) (wdl)  Appearance/Hygiene Unremarkable  Behavior Characteristics Cooperative  Mood Pleasant  Thought Process  Coherency WDL  Content WDL  Delusions None reported or observed  Perception WDL  Hallucination None reported or observed  Judgment Impaired  Confusion None  Danger to Self  Current suicidal ideation? Denies  Self-Injurious Behavior No self-injurious ideation or behavior indicators observed or expressed   Danger to Others  Danger to Others None reported or observed  D: Patient in dayroom reports she had a good day.  A: Medications administered as prescribed. Support and encouragement provided as needed.  R: Patient remains safe on the unit. Will continue to monitor for safety and stability.

## 2020-11-17 ENCOUNTER — Telehealth (HOSPITAL_COMMUNITY): Payer: Self-pay | Admitting: Specialist

## 2020-11-17 NOTE — BH Assessment (Signed)
Care Management - Follow Up [facility] Discharges   Writer attempted to make contact with patient today and was unsuccessful.  Writer was not able to leave a HIPPA compliant voice message.   Per chart review, the patient was placed inpatient at [facility] inpatient psychiatric on 11-11-20 and discharged on 11-15-20.  An aftercare appt was scheduled with [facility] on 11-18-20.

## 2021-11-14 IMAGING — DX DG HAND COMPLETE 3+V*R*
3 series · 3 of 3 positions shown · non-contrast
Comparison: None.

CLINICAL DATA: Right hand injury, laceration

EXAM:
RIGHT HAND - COMPLETE 3+ VIEW

[hand pa]
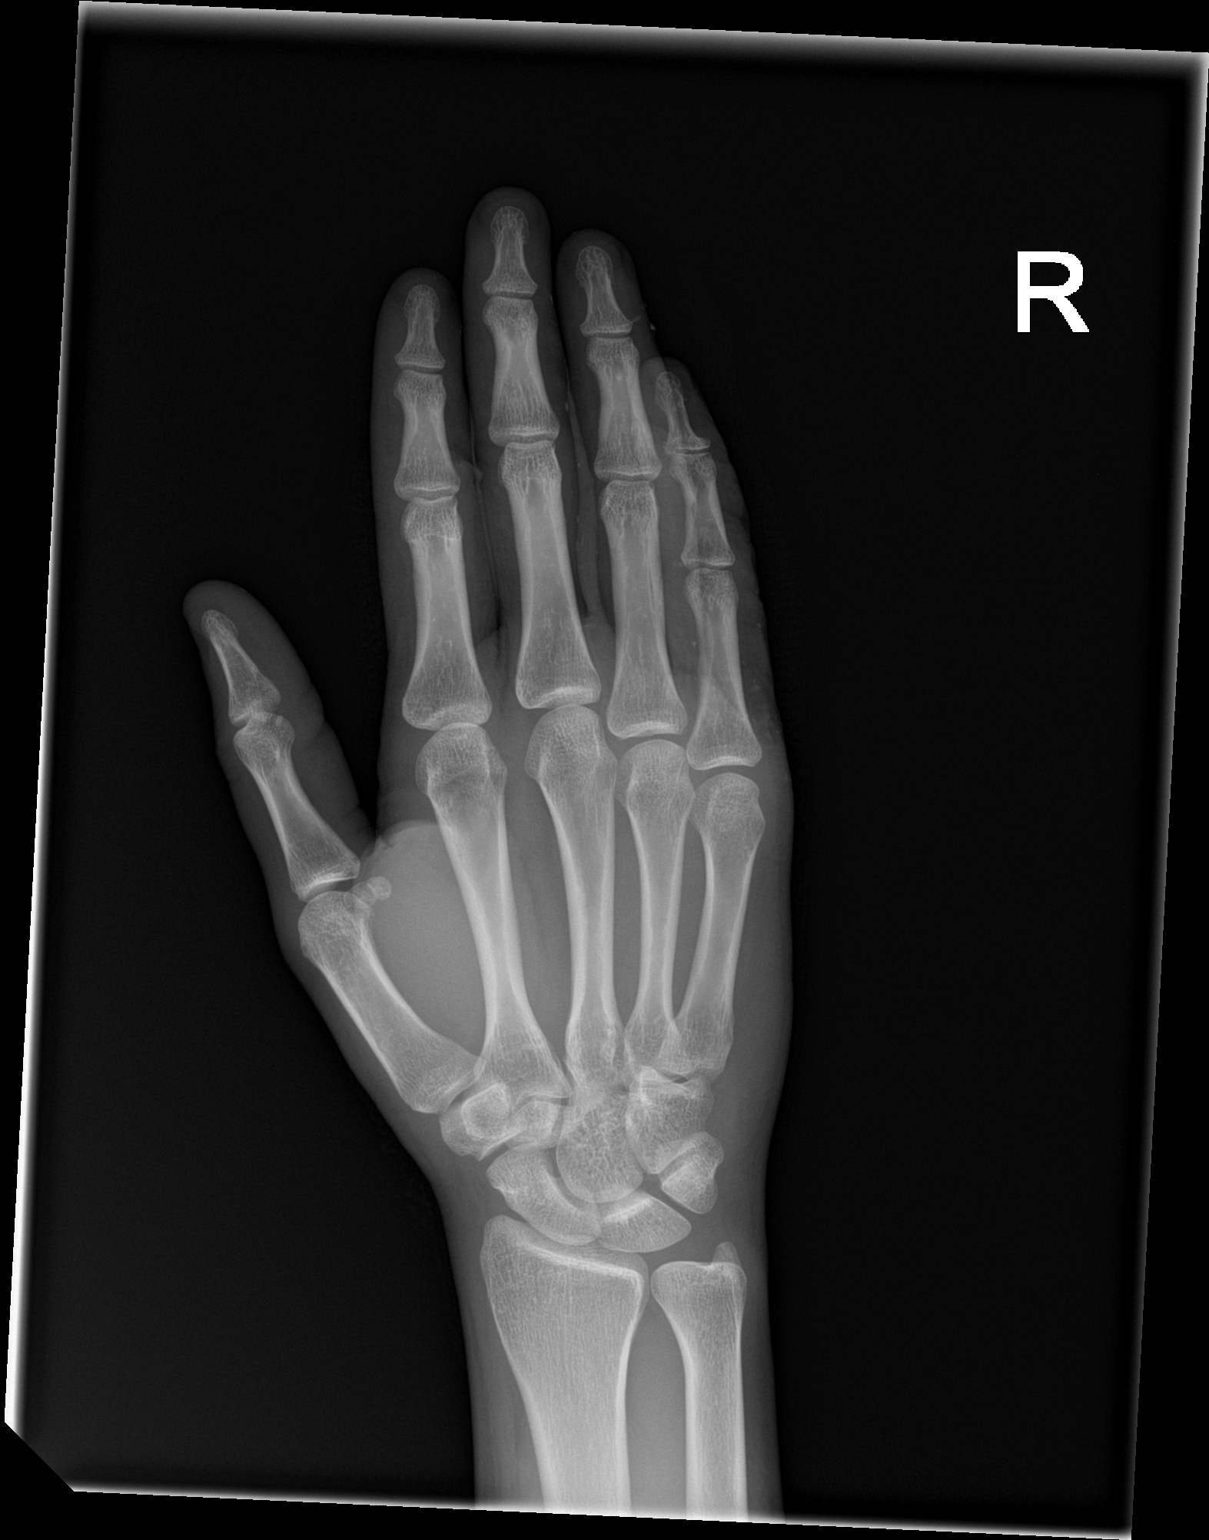

[hand obl]
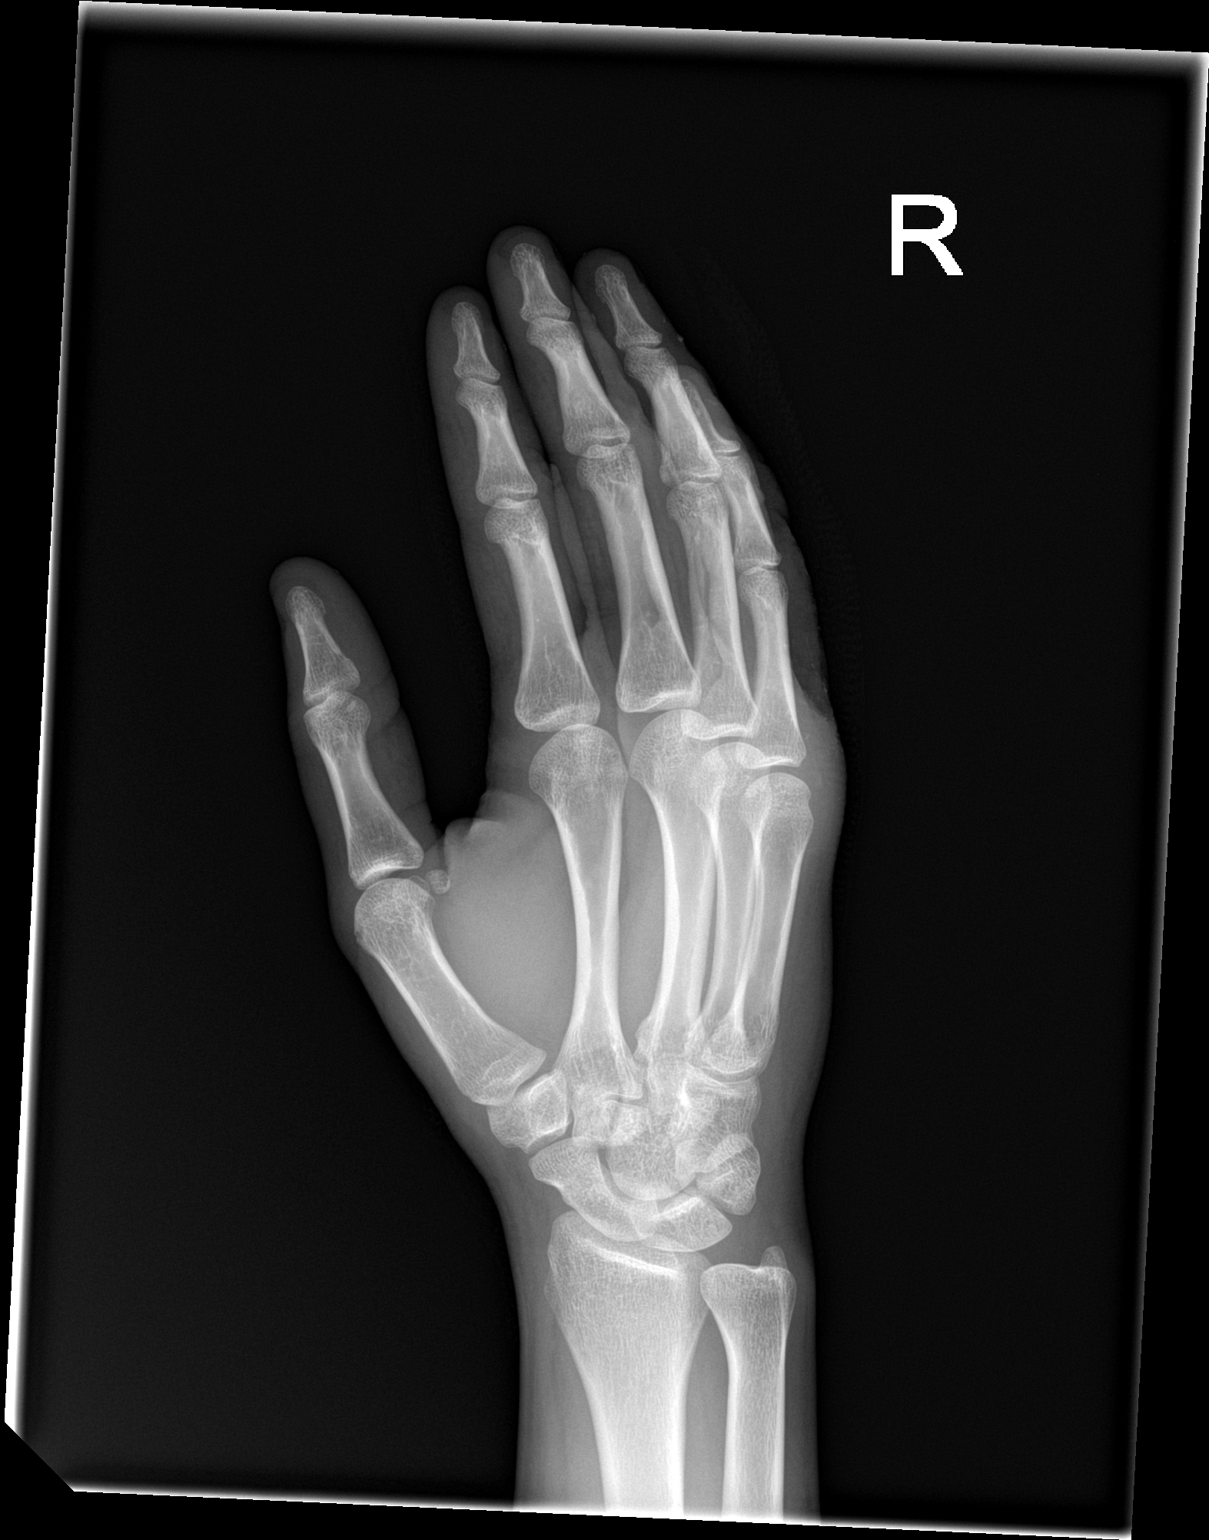

[hand lat]
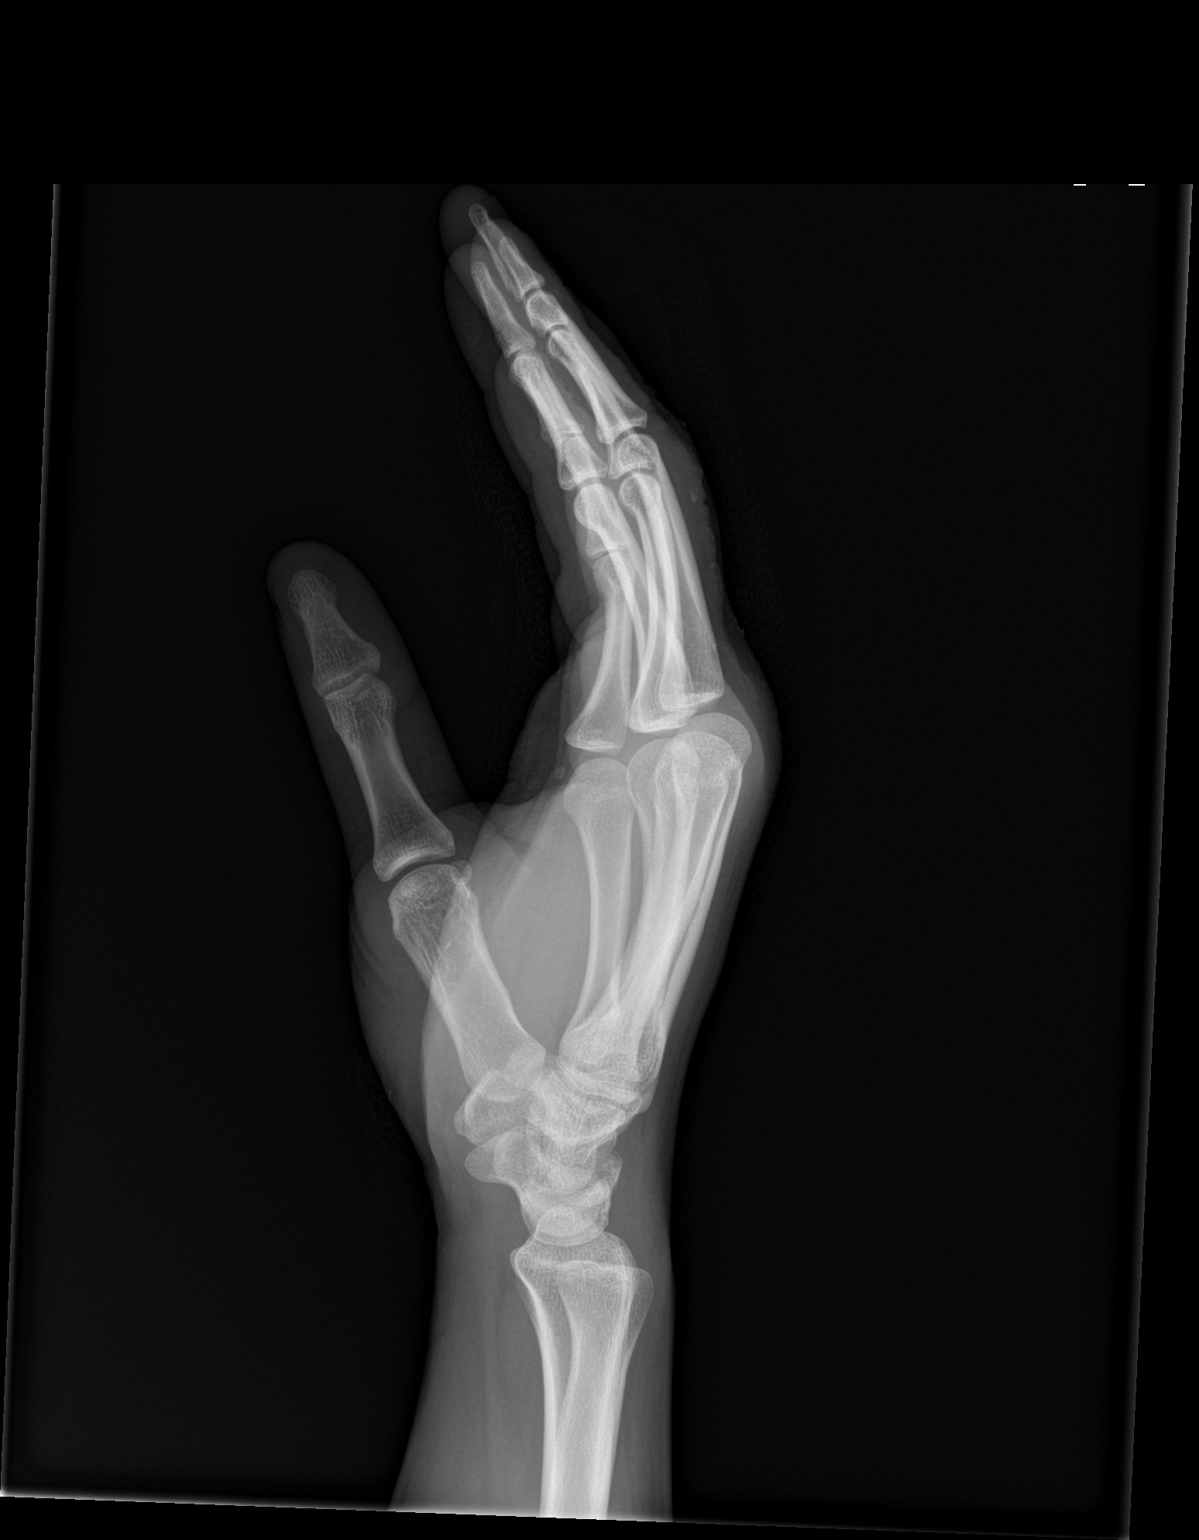

[3 of 3 positions shown; findings below may reference images not displayed]

FINDINGS: Normal alignment. No fracture or dislocation. Joint spaces are
preserved. Tiny punctate radiodensities are seen surrounding the
third, fourth, and fifth digits which may represent radiopaque
debris overlying the digits. Soft tissue laceration noted lateral to
the middle phalanx of the fifth digit.
IMPRESSION: Soft tissue laceration. Multiple punctate radiodensities may
represent debris overlying the third, fourth, and fifth digits.
Correlation with clinical examination is recommended.

No acute fracture or dislocation.

## 2022-09-20 ENCOUNTER — Ambulatory Visit (INDEPENDENT_AMBULATORY_CARE_PROVIDER_SITE_OTHER): Payer: MEDICAID | Admitting: Sports Medicine

## 2022-09-20 ENCOUNTER — Encounter: Payer: Self-pay | Admitting: Sports Medicine

## 2022-09-20 VITALS — BP 102/78 | Ht 66.0 in | Wt 135.0 lb

## 2022-09-20 DIAGNOSIS — G8929 Other chronic pain: Secondary | ICD-10-CM

## 2022-09-20 DIAGNOSIS — M25562 Pain in left knee: Secondary | ICD-10-CM

## 2022-09-20 NOTE — Progress Notes (Signed)
   Subjective:    Patient ID: Diana Phillips, female    DOB: 1995/04/22, 27 y.o.   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  She denies any known trauma.  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  She describes a heavy type feeling along the lateral and posterior lateral knee with running.  It will improve with some relative rest but only to return with running a couple days later.  She denies any swelling.  Denies pain in the right knee.  No pain at rest.  Past medical history reviewed Medications reviewed Allergies reviewed  Review of Systems As above    Objective:   Physical Exam  Well-developed, well-nourished.  No acute distress  Left knee: Full range of motion.  No effusion.  No tenderness to palpation over the medial or lateral joint lines.  No tenderness to palpation along the lateral femoral condyle.  He is stable to valgus and varus stressing.  Negative anterior drawer, negative posterior drawer.  4+/5 strength with resisted hip abduction compared to 5/5 on the right.  Neurovascularly intact distally.      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  She may utilize icing as needed as well.  She tells me that she has a physical therapist that she can work with also.  We will schedule a tentative follow-up appointment for 4 weeks from now.  If symptoms or not improving with today's treatment then consider imaging at that time.  This note was dictated using Dragon naturally speaking software and may contain errors in syntax, spelling, or content which have not been identified prior to signing this note.

## 2022-10-18 ENCOUNTER — Ambulatory Visit: Payer: MEDICAID | Admitting: Sports Medicine
